# Patient Record
Sex: Male | Born: 1965 | Race: White | Hispanic: No | Smoking: Current every day smoker
Health system: Southern US, Community
[De-identification: ages and names within clinical notes are randomized; demographics above are authoritative.]

## PROBLEM LIST (undated history)

## (undated) HISTORY — PX: CARPAL TUNNEL RELEASE: SHX101

## (undated) HISTORY — PX: BACK SURGERY: SHX140

---

## 2001-04-21 ENCOUNTER — Encounter: Admission: RE | Admit: 2001-04-21 | Discharge: 2001-07-20 | Payer: Self-pay

## 2001-07-22 ENCOUNTER — Encounter: Admission: RE | Admit: 2001-07-22 | Discharge: 2001-10-20 | Payer: Self-pay

## 2002-01-13 ENCOUNTER — Encounter: Admission: RE | Admit: 2002-01-13 | Discharge: 2002-04-13 | Payer: Self-pay

## 2008-07-09 ENCOUNTER — Emergency Department (HOSPITAL_BASED_OUTPATIENT_CLINIC_OR_DEPARTMENT_OTHER): Admission: EM | Admit: 2008-07-09 | Discharge: 2008-07-09 | Payer: Self-pay | Admitting: Emergency Medicine

## 2008-07-19 ENCOUNTER — Emergency Department (HOSPITAL_BASED_OUTPATIENT_CLINIC_OR_DEPARTMENT_OTHER): Admission: EM | Admit: 2008-07-19 | Discharge: 2008-07-19 | Payer: Self-pay | Admitting: Emergency Medicine

## 2008-09-18 ENCOUNTER — Emergency Department (HOSPITAL_BASED_OUTPATIENT_CLINIC_OR_DEPARTMENT_OTHER): Admission: EM | Admit: 2008-09-18 | Discharge: 2008-09-18 | Payer: Self-pay | Admitting: Emergency Medicine

## 2009-01-12 ENCOUNTER — Ambulatory Visit: Payer: Self-pay | Admitting: Interventional Radiology

## 2009-01-12 ENCOUNTER — Emergency Department (HOSPITAL_BASED_OUTPATIENT_CLINIC_OR_DEPARTMENT_OTHER): Admission: EM | Admit: 2009-01-12 | Discharge: 2009-01-12 | Payer: Self-pay | Admitting: Emergency Medicine

## 2009-07-04 ENCOUNTER — Ambulatory Visit: Payer: Self-pay | Admitting: Radiology

## 2009-07-04 ENCOUNTER — Emergency Department (HOSPITAL_BASED_OUTPATIENT_CLINIC_OR_DEPARTMENT_OTHER): Admission: EM | Admit: 2009-07-04 | Discharge: 2009-07-04 | Payer: Self-pay | Admitting: Emergency Medicine

## 2010-03-05 NOTE — ED Notes (Signed)
 SABRA

## 2010-06-29 NOTE — Consult Note (Signed)
Ronald Williams, EUDY NO.:  [id number]   MEDICAL RECORD NO.:  [id number]                   PATIENT TYPE:  REC   LOCATION:  [facility]                                  FACILITY:  [facility]   PHYSICIAN:  Zachary George, DO                      DATE OF BIRTH:  15-Feb-1965   DATE OF CONSULTATION:  01/18/2002  DATE OF DISCHARGE:                                   CONSULTATION   The patient returns to the clinic today as scheduled for reevaluation.  He  was last seen on September 18, 2001.  He states that his low back pain is still  significantly improved following radiofrequency medial branch neurotomy on  the left in June of 2003 and on the right in July of 2003.  He states that  he does get some increased pain at times with activity.  His pain today is a  3/10 on a subjective scale.  Overall his functional quality of life indices  have improved significantly.  He is able to be much more active and  performing job-related tasks which he was unable to do prior to the  neurotomy.  He continues using Ultracet four to six per day as needed and  takes Flexeril as needed at bedtime.  In addition, he states he has no  further numbness or paresthesias in his hands and is no longer wearing his  left wrist splint. 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  He continues to seek other employment.  I reviewed  health and history form and 14-point review of systems.  I discussed further  treatment options with the patient.   PHYSICAL EXAMINATION:  GENERAL:  A healthy male in no acute distress.  VITAL SIGNS:  Blood pressure 134/68, pulse 77, respirations 18, O2  saturation 100% on room air.  BACK:  Level pelvis without scoliosis.  There is no tenderness to palpation  of bilateral lumbar paraspinals.  Range of motion is full in all planes with  mild discomfort on flexion.  Manual muscle testing is 5/5 bilateral upper  and lower extremities.  There is no  atrophy noted in the intrinsic hand  muscles bilaterally. Sensory examination is intact to light touch bilateral  upper and lower extremities. Muscle stretch reflexes are 2+/4 bilateral  patellar, medial hamstrings, and Achilles. Straight leg raise is negative in  the seated position bilaterally.   IMPRESSION:  1. Lumbar facet syndrome, status post radiofrequency neurotomy of the lumbar     medial branches bilaterally with significant improvement in low back pain     and functional indices.  2. Carpal tunnel syndrome, left hand, resolved.   PLAN:  1. Continue home-based exercise program.  2. Continue Ultracet and Flexeril as needed.  3. The patient is to follow up with his primary care Ziana Heyliger.  4. The patient is to  return to the clinic on an as-needed basis.  Would     consider repeating radiofrequency procedure if symptoms worsened toward     preprocedure pain levels.  I discussed this with the patient.  As this     remains an option for [place] in the future, I have instructed him to continue     with his exercise program and his current medications.  I counseled him     to expect occasional flare-ups in his pain and I would anticipate that     these would only last for a few days when they occur and that we need to     treat him symptomatically in that regard.   The patient was educated in the above findings and recommendations and  understands. There were no barriers to communication.                                               Zachary George, DO    JW/MEDQ  D:  01/18/2002  T:  01/18/2002  Job:  [id number]   cc:   Paulina Fusi, M.D.

## 2010-06-29 NOTE — Consult Note (Signed)
Ronald Williams, MORING NO.:  [id number]   MEDICAL RECORD NO.:  [id number]                   PATIENT TYPE:   LOCATION:                                       FACILITY:   PHYSICIAN:  Sondra Come, D.O.                 DATE OF BIRTH:  [birth year]   DATE OF CONSULTATION:  09/18/2001  DATE OF DISCHARGE:                  PHYSICAL MEDICINE & REHABILITATION CONSULTATION   REASON FOR CONSULTATION:  The patient returns to clinic today for re-  evaluation.  He is status post right lumbar medial branch radiofrequency  procedure on 08/21/01.  He states his low back pain seems to be progressively  improving.  He is also status post left medial branch radiofrequency  procedure on 07/15/01.  His function and quality of life indices have improved  significantly.  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  His  pain today is a 4/10 on a subjective scale.  In addition, he continues to  have improvement in his left hand numbness and paresthesias secondary to  carpal tunnel syndrome.  He continues wearing his splint as needed, and has  found Zonegran to give him significant improvement.  He continues on  Ultracet approximately six pills a day, and Flexeril 10 mg one to two at  bedtime as needed for sleep.  He has run out of the sample pack of Zonegran,  and a prescription has been called in for him.  He requests 30 day  prescriptions for Flexeril and Ultracet.  I previously had written him 90  day prescriptions to send off to his insurance company and he has not  received these yet.  I review health and history form and 14 point review of  systems.  No new neurologic complaints.  Denies bowel or bladder  dysfunction.   PHYSICAL EXAMINATION:  GENERAL:  A healthy male in no acute distress.  VITAL SIGNS:  Blood pressure 121/73, pulse 66, respirations 18, O2  saturation 99% on room air.  BACK:  Level pelvis without scoliosis.  There is minimal tenderness to  palpation bilateral lumbar paraspinals.  There is full range of motion of  the lumbar spine in all planes and with minimal discomfort.  NEUROLOGIC:  Intact to the lower extremities, including motor, sensory, and  reflexes today.  EXTREMITIES:  Examination of the left upper extremity does not reveal any  atrophy in the left hand muscles.  There is normal strength, especially with  median innervated muscles.  Tinel is mildly positive at the left wrist,  which is improved.   IMPRESSION:  1. Lumbar facet syndrome, status post radiofrequency to the lumbar medial     branches bilaterally with improved low back pain.  2. Carpal tunnel syndrome, left hand, improving.   PLAN:  1. Continue Ultracet one or two p.o. t.i.d. p.r.n. #180 without refills.     This is in addition to a 90 day supply from [employer].  2.  Flexeril 10 mg one or two p.o. q.h.s. p.r.n. #60 without refills.  This     is in addition to a 90 day supply from [employer].  3. Continue Zonegran 100 mg q.d.  New prescription for one p.o. q.d. #90     without refills, to be sent in to the patient's insurance company.  The     patient does have a 30 day supply waiting for him at the pharmacy.  4. The patient is to continue wearing his wrist splint as needed.  5. The patient is to return to clinic in four months for re-evaluation,     sooner as needed.   The patient was educated on the above findings and recommendations and  understands.  There were no barriers to communication.                                               Sondra Come, D.O.    JJW/MEDQ  D:  09/18/2001  T:  09/23/2001  Job:  [id number]

## 2010-06-29 NOTE — Consult Note (Signed)
[facility]  Patient:    Ronald Williams, Ronald Williams Visit Number: [id number] MRN: [id number]          Service Type: Attending:  Sondra Come, D.O. Dictated by:   Sondra Come, D.O. Proc. Date: 07/01/01   CC:         Dorena Bodo, M.D. [facility] [address] [place], [place] [address]                          Consultation Report  NEW PATIENT CONSULTATION NOTE  REFERRING Mistina Coatney:  Dr. Dorena Bodo, [facility] [address]  [place], [place] [address].  CHIEF COMPLAINT:  Low back pain.  HISTORY OF PRESENT ILLNESS:  Ronald Williams is a pleasant 45 year old right-hand dominant male with chronic low back pain who has been followed by Dr. Rayna Sexton at the [facility].  Apparently, Dr. Rayna Sexton no longer takes the patients insurance, and he has been referred here for possible radiofrequency neuroablation of the lumbar facet joints.  The patient had undergone a trial of lumbar facet injections.  He most recently had bilateral L3-4, L4-5, and L5-S1 facet injections which gave him 100% relief for 1.5 weeks.  This positive response has led to consideration of radiofrequency neuroablation.  The patient states that he has had epidural steroid injections in the past without significant relief.  He has been unable to tolerate narcotic based pain medications, and has most recently been treated with Ultracet two p.o. q.i.d. p.r.n., in addition to Flexeril.  His pain today is a 5/10 on a subjective scale.  I review health and history form and 14 point review of systems.  Function and quality of life indices have declined overall.  His sleep is fair to good.  In addition, the patient complains of pain in his left wrist and hand over the past week.  He has associated numbness and paresthesias involving the left middle finger, ring finger, and small finger.  He thought he might have carpal tunnel syndrome so he purchased a  splint for his left wrist which he has been wearing for about four days without any significant improvement.  He was evaluated by his primary care physician today.  He thinks he may also have tennis elbow.  REVIEW OF SYSTEMS:  Some constipation, occasional headaches, and occasional lower extremity pain with ambulation.  PAST MEDICAL HISTORY:  Mild depression.  PAST SURGICAL HISTORY:  Denies.  FAMILY HISTORY:  Diabetes and hypertension.  SOCIAL HISTORY:  The patient smokes 1 pack of cigarettes per day, and I counseled him on the importance of smoking cessation in terms of pain and overall health.  The patient admits to occasional alcohol use.  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  ALLERGIES:  FLONASE.  MEDICATIONS: 1. Flexeril. 2. Wellbutrin. 3. Ultracet. 4. Ibuprofen.  PHYSICAL EXAMINATION:  GENERAL:  A healthy male in no acute distress.  BACK:  Level pelvis without scoliosis.  There is normal lumbar lordosis. Range of motion is full in all planes with significant pain on extension, and extension plus rotation.  Manual muscle testing is 5/5 bilateral lower extremities.  Sensory examination is intact to light touch bilateral lower extremities.  Muscle stretch reflexes are 2+/4 bilateral patellar, 1+/4 bilateral medial hamstrings and Achilles.  Straight leg raise is negative bilaterally, but with  tight hamstrings.  Examination of the left upper extremity reveals no atrophy.  Grip strength is 5/5.  There is positive Tinel over the median nerve at the left wrist, negative over the ulnar nerve at the left wrist, negative over the ulnar nerve at the left elbow.  Phalen is positive.  There is tenderness to palpation over the left lateral epicondyle which is increased with passive wrist flexion and resisted wrist extension.  IMPRESSION: 1. Chronic low back pain with lumbar  facet syndrome with positive response to    lumbar facet injections. 2. Degenerative disc disease of the lumbar spine. 3. Possible mild carpal tunnel syndrome on the left. 4. Mild left lateral epicondylitis.  PLAN: 1. The patient is appropriate for radiofrequency neuroablation of the lumbar    facet joints.  I discussed this with him at length, including the procedure    in detail and its associated risks, benefits, limitations, and alternatives    to include bleeding, infection, nerve injury, failure to relieve pain,    increased pain.  The patient understands the risks associated, and wishes    to proceed in this fashion. 2. Continue current medications, including Flexeril and Ultracet.  We will    have the patient discontinue his Motrin 800 mg approximately seven days    prior to procedure. 3. The patient is instructed to continue wearing his wrist splint. 4. The patient is instructed to use alternating ice and heat for his left    elbow and left wrist. 5. Consider electrodiagnostic studies in two to three weeks to rule out carpal    tunnel syndrome if symptoms are not improving. 6. The patient is to return to clinic next available for radiofrequency    neuroablation.  The patient was educated on the above findings and recommendations and understands.  There were no barriers to communication. Dictated by:   Sondra Come, D.O. Attending:  Sondra Come, D.O. DD:  07/01/01 TD:  07/03/01 Job: [id number]

## 2010-06-29 NOTE — Consult Note (Signed)
[facility]  Patient:    Ronald Williams, Ronald Williams Visit Number: [id number] MRN: [id number]          Service Type: PMG Location: [facility] Attending Physician:  Sondra Come Dictated by:   Sondra Come, D.O. Proc. Date: 07/24/01 Admit Date:  07/22/2001                            Consultation Report  HISTORY:  The patient returns to the clinic today for electrodiagnostic studies of his left upper extremity to rule out carpal tunnel syndrome. Please refer to the electrodiagnostic study report for details of the study. 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  This is consistent with carpal tunnel syndrome.  I discussed further treatment options with the patient, who has been using a splint on his left wrist without any significant improvement.  We discussed chiropractic manipulation, medications and local steroid injections.  We also discussed surgical options.  The patient wishes to have a steroid injection into the carpal tunnel, and we discussed this at length.  This is reasonable.  I discussed the risks, benefits, limitations and alternatives of this and the patient wishes to proceed.  PROCEDURE:  Left carpal tunnel steroid injection.  Informed consent was obtained.  The skin was prepped in the usual fashion with Betadine and alcohol.  The left carpal tunnel was injected with 0.5 cc of Kenalog, 40 mg/cc plus 1 cc of 1% lidocaine using a 27-gauge, 1/2-inch needle without any complications.  There were no paresthesias noted during the injection. The patient tolerated the procedure well.  Postinjection, the patient had an appropriate right median nerve block.  PLAN:  The patient is to return to the clinic for reevaluation. Dictated by:   Sondra Come, D.O. Attending Physician:  Sondra Come DD:  07/26/01 TD:  07/28/01 Job: [id number]

## 2010-06-29 NOTE — Consult Note (Signed)
[facility]  Patient:    BRAD, LIEURANCE Visit Number: [id number] MRN: [id number]          Service Type: PMG Attending Physician:  Sondra Come Dictated by:   Sondra Come, D.O. Proc. Date: 07/15/01 Admit Date:  04/21/2001 Discharge Date: 07/20/2001   CC:         Dorena Bodo, M.D., [facility], [address] [place], [place] [address]                          Consultation Report  HISTORY OF PRESENT ILLNESS:  Mr. Coykendall returns to clinic today as scheduled for radiofrequency neural ablation of the left lumbar facets.  The patient continues to have low back pain which he states has worsened.  He feels like the lumbar facet injections are wearing off.  Currently his pain is a 10/10 on a subjective scale.  I review health and history form, and 14-point review of systems.  The patient denies any new neurologic complaints.  Denies bowel and bladder dysfunction.  He continues taking Ultracet, which helps take the edge off of his pain.  The patient continues to have left hand numbness, paresthesias and pain, which are not improved with the wrist splint.  PHYSICAL EXAMINATION:  GENERAL:  The patient is a healthy male in no acute distress.  VITAL SIGNS:  Blood pressure 148/64, pulse 72, respirations 14 and regular, O2 saturation is 97% on room air.  NEUROLOGIC:  No new neurologic findings in the lower extremities including motor, sensory and reflexes at this time.  IMPRESSION: 1. Lumbar facet syndrome with positive response to lumbar facet injections. 2. Degenerative disk disease of the lumbar spine. 3. Probable carpal tunnel syndrome on the left.  PLAN:  Radiofrequency neural ablation of the left L2, L3, and L4 medial branches, as well as the left L5 dorsal ramus.  The procedure again was described to the patient in detail including risks, benefits, limitations, and alternatives.  The patient wishes to proceed.  Informed consent was  obtained.  DESCRIPTION OF PROCEDURE:  The patient was brought back to the fluoroscopy suite where an IV was started.  The patient was given 2 mg of Versed for minimal sedation.  The patient remained awake and alert during the procedure. The skin was prepped and draped in the usual sterile fashion.  Skin and subcutaneous tissues were anesthetized with 3 cc of 1% lidocaine at four independent needle access points.  Under direct fluoroscopic guidance, a 20-gauge radiofrequency needle with 10 mm curved active tip was advanced to the junction of the left sacral ala and S1 superior articular process under multiple fluoroscopic views.  The needle was then advanced to the junction of the L3, L4, and L5 superior articular processes with their corresponding transverse processes under multiple fluoroscopic views.  These were done using independent needle access points.  Once bony contact was made in each location, negative aspiration was performed.  Needle tip confirmation was obtained with the injection of 0.2 cc of Omnipaque 180.  There was no epidural or epiradicular or vascular flow.  Each site was then stimulated appropriately for sensory and motor.  This was followed by the injection of 0.25 cc of Kenalog 40 mg/cc plus 0.5 cc of 0.25% preservative-free Bupivacaine.  Needle tip placement was reconfirmed under fluoroscopy, and each site was lesioned for 70 seconds at 70 degrees.  The patient tolerated the procedure well.  He was monitored post-injection  and discharged in stable condition. Post-injection pain is 5/10.  Discharge instructions were given.  The patient was instructed to use ice on his back as needed for rebound pain.  RECOMMENDATION: 1. The patient is to continue Ultracet as needed. 2. The patient is to return to clinic for electrodiagnostic studies of his    left upper extremity to rule out carpal tunnel syndrome versus    radiculopathy. 3. Anticipate radiofrequency neural  ablation of the right lumbar facets.  The patient was educated on the above findings and recommendations, and understands.  There were no barriers to communication. Dictated by:   Sondra Come, D.O. Attending Physician:  Sondra Come DD:  07/15/01 TD:  07/18/01 Job: [id number]

## 2010-06-29 NOTE — Consult Note (Signed)
[facility]  Patient:    Ronald Williams, Ronald Williams Visit Number: [id number] MRN: [id number]          Service Type: PMG Location: [facility] Attending Physician:  Sondra Come Dictated by:   Sondra Come, D.O. Proc. Date: 08/21/01 Admit Date:  07/22/2001                            Consultation Report  HISTORY:  Mr. Mousel returns to the clinic today for reevaluation and right lumbar facet radiofrequency neural oblation. The patient underwent left lumbar facet radiofrequency neural oblation on July 15, 2001 with significant improvement of his low back pain. He states he has been more active over the past week or two and has had increased pain in his low back. His pain is more centrally located today. He would like to proceed with radiofrequency neural oblation and he is appropriate based on previous diagnostic lumbar facet blocks. In terms of the patients left carpal tunnel syndrome, he had complete relief of his left hand numbness and paresthesias for the anesthetic phase of the carpal tunnel injection which was performed on July 24, 2001. He states that he continues to have some tingling in his hand but it is improved. He continues to have improving strength subjectively. Overall his pain today is a 7/10 on a subjective scale. His functional and quality of life indices have improved, his sleep is good. He continues on Ultracet two pills three times daily and Flexeril 10 mg 1-2 at bedtime. He also continues to take Motrin as needed which does not seem to be improving his symptoms. I reviewed his health and history form and 14 point review of systems. The patient denies any new neurologic complaints, denies bowel or bladder dysfunction.  PHYSICAL EXAMINATION: Reveals a healthy male in no acute distress. Blood pressure 133/78, pulse 79, respirations 16, O2 saturation 100% on room air. Examination of the back reveals significant tightness and tenderness in the right  lumbar paraspinal region. The leg paraspinal muscles are soft and supple with minimal discomfort. Range of motion reveals pain on extension. Manual muscle testing is 5/5 bilateral lower extremities. Sensory examination is intact to light touch bilateral lower extremities. Muscle stretch reflexes are symmetric bilateral lower extremities. No atrophy in the left intrinsic hand muscles. Minimal tingling sensation to light touch in the fingertips today on the left.  IMPRESSION: 1. Lumbar facet syndrome. 2. Carpal tunnel syndrome.  PLAN: 1. Right lumbar facet radiofrequency and neural oblation. 2. Continue Ultracet 1-2 p.o. t.i.d. as needed #540 without refills. This is    a three month supply. 3. Continue Flexeril 10 mg 1-2 p.o. q.h.s. as needed #180 without refills.    This is a three month supply. 4. Trial of zonegran 100 mg q.d. #14 day sample pack given. If this is    helpful with the patients paresthesias patient to call our clinic and    we will call in a refill or write a prescription. 5. The patient instructed to resume Bextra 20 mg q.d. to b.i.d. as needed. 6. The patient will return to the clinic in one month for reevaluation. Will    consider repeat carpal tunnel injection versus surgical referral.  PROCEDURE:  Right lumbar facet radiofrequency neural oblation of the L2, L3, and L4 medial branches as well as the L5 dorsal ramus.  The procedure was described to the patient in detail including the The risks, benefits, limitations and alternatives. The  patient wishes to proceed. Informed consent was obtained. The patient was brought back to the fluoroscopy suite where an IV was started. The patient was given 2 mg of Versed for minimal but appropriate conscious sedation. The patient remained awake and alert during the procedure.  DESCRIPTION OF PROCEDURE:  The skin was prepped and draped in the usual sterile fashion. The skin and subcutaneous tissues were anesthetized with 2-3  cc of 1% lidocaine at four independent needle access points. Under direct fluoroscopic guidance, a 20 gauge radiofrequency needle with 10 mm active curved tip was advanced to the junction of the right sacral ala and S1 superior articular process under multiple fluoroscopic views including AP, oblique and lateral. The needle was then advanced to the junction of the L3, L4 and L5 superior articular processes with their corresponding transverse processes again under multiple fluoroscopic views including oblique and lateral. Independent needle access points were used. Once bony contact was made at each location, negative aspiration was performed. Needle tip placement was confirmed with the injection of 0.2 cc of Omnipaque 180 with appropriate flow. There was no epidural, epiradicular, or vascular flow. Each site was then stimulated appropriately for sensory and motor. This was then followed by the injection of 0.25 cc of Kenalog 40 mg/cc plus 0.5 cc of 0.25% preservative free bupivacaine. Needle tip placement was reconfirmed under fluoroscopy and each site was lesioned for 70 seconds at 70 degrees. The patient tolerated the procedure well. He was monitored post injection and discharged in stable condition. Discharge instructions were given. The patient instructed to use ice on his back for rebound pain as needed.  The patient was educated on the above findings and recommendations and understands. No barriers to communication. Dictated by:   Sondra Come, D.O. Attending Physician:  Sondra Come DD:  08/21/01 TD:  08/22/01 Job: [id number]

## 2011-12-03 ENCOUNTER — Emergency Department (HOSPITAL_COMMUNITY)
Admission: EM | Admit: 2011-12-03 | Discharge: 2011-12-03 | Disposition: A | Discharge status: Home / Self Care | Payer: Self-pay | Attending: Emergency Medicine | Admitting: Emergency Medicine

## 2011-12-03 ENCOUNTER — Encounter (HOSPITAL_COMMUNITY): Payer: Self-pay

## 2011-12-03 ENCOUNTER — Emergency Department (HOSPITAL_COMMUNITY): Payer: Self-pay

## 2011-12-03 DIAGNOSIS — N200 Calculus of kidney: Secondary | ICD-10-CM | POA: Insufficient documentation

## 2011-12-03 DIAGNOSIS — F17200 Nicotine dependence, unspecified, uncomplicated: Secondary | ICD-10-CM | POA: Insufficient documentation

## 2011-12-03 LAB — CBC
HCT: 40.4 % (ref 39.0–52.0)
Hemoglobin: 13.9 g/dL (ref 13.0–17.0)
MCH: 30.6 pg (ref 26.0–34.0)
MCHC: 34.4 g/dL (ref 30.0–36.0)
MCV: 89 fL (ref 78.0–100.0)
Platelets: 205 K/uL (ref 150–400)
RBC: 4.54 MIL/uL (ref 4.22–5.81)
RDW: 12.5 % (ref 11.5–15.5)
WBC: 10.9 K/uL — ABNORMAL HIGH (ref 4.0–10.5)

## 2011-12-03 LAB — URINALYSIS, ROUTINE W REFLEX MICROSCOPIC
Bilirubin Urine: NEGATIVE
Glucose, UA: NEGATIVE mg/dL
Ketones, ur: NEGATIVE mg/dL
Leukocytes, UA: NEGATIVE
Nitrite: NEGATIVE
Protein, ur: 30 mg/dL — AB
Specific Gravity, Urine: 1.017 (ref 1.005–1.030)
Urobilinogen, UA: 1 mg/dL (ref 0.0–1.0)
pH: 8.5 — ABNORMAL HIGH (ref 5.0–8.0)

## 2011-12-03 LAB — URINE MICROSCOPIC-ADD ON

## 2011-12-03 LAB — BASIC METABOLIC PANEL WITH GFR
BUN: 14 mg/dL (ref 6–23)
CO2: 27 meq/L (ref 19–32)
Calcium: 9.8 mg/dL (ref 8.4–10.5)
Chloride: 104 meq/L (ref 96–112)
Creatinine, Ser: 1.12 mg/dL (ref 0.50–1.35)
GFR calc Af Amer: >90 mL/min
GFR calc non Af Amer: 78 mL/min — ABNORMAL LOW
Glucose, Bld: 104 mg/dL — ABNORMAL HIGH (ref 70–99)
Potassium: 4 meq/L (ref 3.5–5.1)
Sodium: 141 meq/L (ref 135–145)

## 2011-12-03 MED ORDER — KETOROLAC TROMETHAMINE 60 MG/2ML IM SOLN: 60.0000 mg | Freq: Once | INTRAMUSCULAR | Status: DC

## 2011-12-03 MED ORDER — NAPROXEN 375 MG PO TABS: 375.0000 mg | ORAL_TABLET | Freq: Two times a day (BID) | ORAL | Status: DC

## 2011-12-03 MED ORDER — HYDROCODONE-ACETAMINOPHEN 5-325 MG PO TABS: 1.0000 | ORAL_TABLET | Freq: Four times a day (QID) | ORAL | Status: DC | PRN

## 2011-12-03 MED ORDER — FENTANYL CITRATE 0.05 MG/ML IJ SOLN
50.0000 ug | Freq: Once | INTRAMUSCULAR | Status: AC
  Administered 2011-12-03 (×2): 50 ug via INTRAVENOUS
  Filled 2011-12-03: qty 2

## 2011-12-03 MED ORDER — KETOROLAC TROMETHAMINE 30 MG/ML IJ SOLN
30.0000 mg | Freq: Once | INTRAMUSCULAR | Status: AC
  Administered 2011-12-03 (×2): 30 mg via INTRAVENOUS
  Filled 2011-12-03: qty 1

## 2011-12-03 NOTE — Discharge Instructions (Signed)
 You have been diagnosed with kidney stones.  Use your pain medication as prescribed and do not operate heavy machinery while on this medication. Note that your pain medication contains Acetaminophen  (Tylenol ), therefore it is not recommended to take additional Tylenol  while on your pain medication. Continue to drink fluids to help you pass the stones.  Followup with your primary care doctor in regards to your hospital visit.  Return to the ED immediately if you develop fever that persists > 101, uncontrolled pain or vomiting, or other concerns.  Read the instructions below to learn more about kidney stones.  If you do not have a primary care doctor to followup with, use the resource guide attached to help you find one.   Kidney Stones Kidney stones (ureteral lithiasis) are solid masses that form inside your kidneys. The intense pain is caused by the stone moving through the kidney, ureter, bladder, and urethra (urinary tract). When the stone moves, the ureter starts to spasm around the stone. The stone is usually passed in the urine.  HOME CARE  Drink enough fluids to keep your pee (urine) clear or pale yellow. This helps to get the stone out.   Only take medicine as told by your doctor.   Follow up with your doctor as told.  GET HELP RIGHT AWAY IF:   Your pain does not get better with medicine.   You have a fever.   Your pain increases and gets worse over 18 hours.   You have new belly (abdominal) pain.   You feel faint or pass out.  MAKE SURE YOU:   Understand these instructions.   Will watch your condition.   Will get help right away if you are not doing well or get worse.   Affordable Health Care Below is information to help you research and register for [facility] (Obama care) that was started on November 12, 2011. Keep in mind when comparing different insurance plan prices that you may qualify for for financial assistance to lower your costs  The same application  will let you find out if you and your family members might qualify for free or low-cost coverage available through Medicaid or the [employer] (CHIP).  www.Http://www.long-Ju.com/: Where individuals can learn about the Marketplace and the upcoming benefits (including where they can find local assistance), or be connected to appropriate resources in states that are running their own Marketplace.  Health Insurance Marketplace Call Center: If you have questions, call [phone]. TTY users should call [phone], Open 24 hours a day, 7 days a week.   epompanobeach.com.br: Where to get the latest resources to help spread the word about Affordable Health Care & the Marketplace. This website also has health visitor in many different languages.    More information:   Health insurance companies selling plans through the Marketplace cant deny you coverage or charge you more due to pre-existing health conditions, and they cant charge women and men different premiums based on their gender.  The information is all available online, but you can apply 4 ways: online, by phone, by mail, or in-person with the help of a trained assister or navigator.   Each health plan will generally offer comprehensive coverage, including a core set of essential health benefits like doctor visits, preventive care, maternity care, hospitalization, prescription drugs, and more.   No matter where you live, there will be a Marketplace in your state, offering plans from private companies where youll be able to compare your health  coverage options based on price, benefits, quality, and other features important to you before you make a choice.   If you have no primary doctor, here are some resources that may be helpful:  Medicaid-accepting [facility] Providers:   - [facility]- [address]      [phone]      Mon-Fri 9am-7pm, Sat 9am-1pm   - [facility]- [address] [place], [place]      [phone]   - [facility]- [address], [address] [place]      [phone]   [facility]- [address]      [phone]   - Kennieth Leech- [address] [place], [address]      [phone]      Only accepts [place] patients after they have her name applied to their card   Self Pay (no insurance) in [place]:   - Sickle Cell Patients: Dr Camellia Hutchinson, [facility]      [address] [place]      [phone]   - Physician Referral Service- [phone]   - [facility]- [address] [place]      [phone]   [facility] [place]- [address]   - [facility]- see information above      (Speak to [employer] if you do not have insurance)   - [facility]- [address]      [phone]   - Dr Catalina-  [address], [address], [place]

## 2011-12-03 NOTE — ED Notes (Signed)
Pt undressed, in gown, on continuous pulse oximetry and blood pressure cuff; urinal handed to pt to use when he can

## 2011-12-03 NOTE — ED Provider Notes (Signed)
History     CSN: [id number]  Arrival date & time 12/03/11  1009   First MD Initiated Contact with Patient 12/03/11 1006      Chief Complaint  Patient presents with  . Abdominal Pain    (Consider location/radiation/quality/duration/timing/severity/associated sxs/prior treatment) HPI Comments: Patient is a 46 year old male with a history of nephrolithiasis presents emergency department complaining of left-sided flank pain.  Onset of symptoms began this morning & described as sharp. Severity is rated at 10/10 with radiation to groin. Associated symptoms include nausea.  Patient denies any dysuria, hematuria, testicular or penile pain, discharge, vomiting, fever, night sweats, chills, back injury, back pain, change in bowel movements, melena, hematemesis, hematochezia.  Pain is worsened by any movement.  The history is provided by the patient.    History reviewed. No pertinent past medical history.  Past Surgical History  Procedure Date  . Carpal tunnel release   . Back surgery     History reviewed. No pertinent family history.  History  Substance Use Topics  . Smoking status: Current Every Day Smoker  . Smokeless tobacco: Not on file  . Alcohol Use: Yes     social drinker      Review of Systems  Constitutional: Negative for fever, chills, diaphoresis and fatigue.  HENT: Negative for ear pain and sinus pressure.   Eyes: Negative for visual disturbance.  Respiratory: Negative for cough.   Cardiovascular: Negative for chest pain.  Gastrointestinal: Negative for nausea, vomiting and abdominal pain.  Genitourinary: Positive for flank pain. Negative for dysuria, urgency and difficulty urinating.  Skin: Negative for color change and rash.  Neurological: Negative for dizziness and headaches.  Psychiatric/Behavioral: Negative for confusion.  All other systems reviewed and are negative.    Allergies  Codeine  Home Medications   Current Outpatient Rx  Name Route Sig  Dispense Refill  . BENADRYL PO Oral Take 1 tablet by mouth daily as needed. allergies    . IBUPROFEN 200 MG PO TABS Oral Take 400 mg by mouth every 6 (six) hours as needed. For pain    . OVER THE COUNTER MEDICATION Nasal Place 1 spray into the nose daily as needed. Nasal spray  For nasal congestion and dryness      BP 116/56  Pulse 60  Temp 97.5 F (36.4 C) (Oral)  Resp 18  SpO2 99%  Physical Exam  Nursing note and vitals reviewed. Constitutional: He is oriented to person, place, and time. He appears well-developed and well-nourished. He appears distressed.  HENT:  Head: Normocephalic and atraumatic.  Eyes: Conjunctivae normal and EOM are normal.  Neck: Normal range of motion. Neck supple.  Cardiovascular: Normal rate, regular rhythm and normal heart sounds.   Pulmonary/Chest: Effort normal.  Abdominal: Soft. Normal appearance and bowel sounds are normal. He exhibits no distension, no ascites and no pulsatile midline mass. There is tenderness (left CVA tenderness). There is CVA tenderness.  Musculoskeletal: Normal range of motion.  Neurological: He is alert and oriented to person, place, and time.  Skin: Skin is warm and dry. He is not diaphoretic.  Psychiatric: He has a normal mood and affect. His behavior is normal.    ED Course  Procedures (including critical care time)  Labs Reviewed  CBC - Abnormal; Notable for the following:    WBC 10.9 (*)     All other components within normal limits  BASIC METABOLIC PANEL - Abnormal; Notable for the following:    Glucose, Bld 104 (*)  GFR calc non Af Amer 78 (*)     All other components within normal limits  URINALYSIS, ROUTINE W REFLEX MICROSCOPIC   Ct Abdomen Pelvis Wo Contrast  12/03/2011  *RADIOLOGY REPORT*  Clinical Data: Left flank pain  CT ABDOMEN AND PELVIS WITHOUT CONTRAST  Technique:  Multidetector CT imaging of the abdomen and pelvis was performed following the standard protocol without intravenous contrast.   Comparison: None.  Findings: Sagittal images of the spine shows degenerative changes lumbar spine at L1-L2 level.  Lung bases are unremarkable.  Mild atherosclerotic calcifications of the abdominal aorta.  Unenhanced liver, spleen, pancreas and adrenals are unremarkable. There is mild left hydronephrosis and mild proximal left hydroureter.  Nonobstructive calcified calculus in lower pole of the right kidney measures 1 mm.  Nonobstructive calcified calculus in mid pole of the left kidney measures 3 mm.  In axial image 34 there is a calcified obstructive calculus in the proximal left ureter measures 4.4 mm at the level of the lower endplate of the L2 vertebral body.  No small bowel obstruction.  Moderate colonic stool.  Normal appendix is clearly visualized axial image 51.  No pericecal inflammation.  Bilateral distal ureter is unremarkable.  Prostate gland seminal vesicles are unremarkable.  Urinary bladder is unremarkable.  No calcified calculi are noted within urinary bladder.  No destructive bony lesions are noted within the pelvis.  IMPRESSION:  1.  There is bilateral nonobstructive nephrolithiasis. 2.  Mild left hydronephrosis and proximal left hydroureter. 3.  There is 4.4 mm calcified obstructive calculus in proximal left ureter at the level of the lower endplate of the L2 vertebral body. 4.  Normal appendix. 5.  Unremarkable urinary bladder.   Original Report Authenticated By: Natasha Mead, M.D.      No diagnosis found.    MDM  Kidney stone   Pt has been diagnosed with a Kidney Stone via CT. There is no evidence of significant hydronephrosis, serum creatine WNL, vitals sign stable and the pt does not have irratractable vomiting. Pt will be dc home with pain medications & has been advised to follow up with PCP.        Jaci Carrel, [place] 12/03/11 1155

## 2011-12-03 NOTE — ED Notes (Signed)
Patient transported to CT 

## 2011-12-03 NOTE — ED Provider Notes (Signed)
Medical screening examination/treatment/procedure(s) were performed by non-physician practitioner and as supervising physician I was immediately available for consultation/collaboration.    Hamp Moreland L Arienne Gartin, MD 12/03/11 1157 

## 2011-12-03 NOTE — ED Notes (Signed)
Pt presents to ED with abdominal pain radiating to groin. Pt states symptoms started this am.

## 2013-05-15 ENCOUNTER — Emergency Department (HOSPITAL_COMMUNITY)
Admission: EM | Admit: 2013-05-15 | Discharge: 2013-05-15 | Disposition: A | Discharge status: Home / Self Care | Payer: No Typology Code available for payment source | Attending: Emergency Medicine | Admitting: Emergency Medicine

## 2013-05-15 ENCOUNTER — Emergency Department (HOSPITAL_COMMUNITY): Payer: No Typology Code available for payment source

## 2013-05-15 ENCOUNTER — Encounter (HOSPITAL_COMMUNITY): Payer: Self-pay | Admitting: Emergency Medicine

## 2013-05-15 DIAGNOSIS — Z23 Encounter for immunization: Secondary | ICD-10-CM | POA: Insufficient documentation

## 2013-05-15 DIAGNOSIS — T148XXA Other injury of unspecified body region, initial encounter: Secondary | ICD-10-CM

## 2013-05-15 DIAGNOSIS — S91009A Unspecified open wound, unspecified ankle, initial encounter: Secondary | ICD-10-CM

## 2013-05-15 DIAGNOSIS — S81009A Unspecified open wound, unspecified knee, initial encounter: Secondary | ICD-10-CM | POA: Insufficient documentation

## 2013-05-15 DIAGNOSIS — S2239XA Fracture of one rib, unspecified side, initial encounter for closed fracture: Secondary | ICD-10-CM

## 2013-05-15 DIAGNOSIS — S01501A Unspecified open wound of lip, initial encounter: Secondary | ICD-10-CM | POA: Insufficient documentation

## 2013-05-15 DIAGNOSIS — S2249XA Multiple fractures of ribs, unspecified side, initial encounter for closed fracture: Secondary | ICD-10-CM | POA: Insufficient documentation

## 2013-05-15 DIAGNOSIS — F17200 Nicotine dependence, unspecified, uncomplicated: Secondary | ICD-10-CM | POA: Insufficient documentation

## 2013-05-15 DIAGNOSIS — S0083XA Contusion of other part of head, initial encounter: Secondary | ICD-10-CM

## 2013-05-15 DIAGNOSIS — S81809A Unspecified open wound, unspecified lower leg, initial encounter: Secondary | ICD-10-CM

## 2013-05-15 DIAGNOSIS — S0003XA Contusion of scalp, initial encounter: Secondary | ICD-10-CM | POA: Insufficient documentation

## 2013-05-15 DIAGNOSIS — S1093XA Contusion of unspecified part of neck, initial encounter: Secondary | ICD-10-CM

## 2013-05-15 MED ORDER — OXYCODONE-ACETAMINOPHEN 5-325 MG PO TABS: 2.0000 | ORAL_TABLET | ORAL | Status: DC | PRN

## 2013-05-15 MED ORDER — PROMETHAZINE HCL 25 MG PO TABS: 25.0000 mg | ORAL_TABLET | Freq: Four times a day (QID) | ORAL | Status: DC | PRN

## 2013-05-15 MED ORDER — TETANUS-DIPHTH-ACELL PERTUSSIS 5-2.5-18.5 LF-MCG/0.5 IM SUSP
0.5000 mL | Freq: Once | INTRAMUSCULAR | Status: AC
  Administered 2013-05-15 (×2): 0.5 mL via INTRAMUSCULAR
  Filled 2013-05-15: qty 0.5

## 2013-05-15 MED ORDER — HYDROMORPHONE HCL PF 1 MG/ML IJ SOLN
2.0000 mg | Freq: Once | INTRAMUSCULAR | Status: AC
  Administered 2013-05-15 (×2): 2 mg via INTRAMUSCULAR
  Filled 2013-05-15: qty 2

## 2013-05-15 MED ORDER — HYDROMORPHONE HCL PF 1 MG/ML IJ SOLN
1.0000 mg | Freq: Once | INTRAMUSCULAR | Status: AC
  Administered 2013-05-15 (×2): 1 mg via INTRAMUSCULAR
  Filled 2013-05-15: qty 1

## 2013-05-15 MED ORDER — ONDANSETRON 4 MG PO TBDP
8.0000 mg | ORAL_TABLET | Freq: Once | ORAL | Status: AC
  Administered 2013-05-15 (×2): 8 mg via ORAL
  Filled 2013-05-15: qty 2

## 2013-05-15 NOTE — ED Notes (Signed)
Patient transported to X-ray 
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
## 2013-05-15 NOTE — ED Notes (Signed)
Pt comfortable with discharge and follow up instructions. Prescriptions x2. 

## 2013-05-15 NOTE — ED Notes (Signed)
Merrell, PA at bedside 
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
## 2013-05-16 NOTE — ED Provider Notes (Signed)
Medical screening examination/treatment/procedure(s) were performed by non-physician practitioner and as supervising physician I was immediately available for consultation/collaboration.   EKG Interpretation None       Casmere Hollenbeck K Linker, MD 05/16/13 0709 

## 2014-08-05 ENCOUNTER — Encounter (HOSPITAL_COMMUNITY): Payer: Self-pay | Admitting: *Deleted

## 2014-08-05 ENCOUNTER — Emergency Department (HOSPITAL_COMMUNITY)
Admission: EM | Admit: 2014-08-05 | Discharge: 2014-08-05 | Disposition: A | Discharge status: Home / Self Care | Payer: Federal, State, Local not specified - Other | Attending: Emergency Medicine | Admitting: Emergency Medicine

## 2014-08-05 ENCOUNTER — Inpatient Hospital Stay (HOSPITAL_COMMUNITY)
Admission: AD | Admit: 2014-08-05 | Discharge: 2014-08-11 | DRG: 885 | Disposition: A | Discharge status: Home / Self Care | Payer: Federal, State, Local not specified - Other | Source: Intra-hospital | Attending: Psychiatry | Admitting: Psychiatry

## 2014-08-05 ENCOUNTER — Emergency Department (HOSPITAL_COMMUNITY): Payer: No Typology Code available for payment source

## 2014-08-05 ENCOUNTER — Encounter (HOSPITAL_COMMUNITY): Payer: Self-pay

## 2014-08-05 DIAGNOSIS — F322 Major depressive disorder, single episode, severe without psychotic features: Secondary | ICD-10-CM | POA: Insufficient documentation

## 2014-08-05 DIAGNOSIS — F1210 Cannabis abuse, uncomplicated: Secondary | ICD-10-CM | POA: Insufficient documentation

## 2014-08-05 DIAGNOSIS — M79641 Pain in right hand: Secondary | ICD-10-CM

## 2014-08-05 DIAGNOSIS — K219 Gastro-esophageal reflux disease without esophagitis: Secondary | ICD-10-CM | POA: Diagnosis present

## 2014-08-05 DIAGNOSIS — F419 Anxiety disorder, unspecified: Secondary | ICD-10-CM | POA: Diagnosis present

## 2014-08-05 DIAGNOSIS — S6991XA Unspecified injury of right wrist, hand and finger(s), initial encounter: Secondary | ICD-10-CM | POA: Insufficient documentation

## 2014-08-05 DIAGNOSIS — M25561 Pain in right knee: Secondary | ICD-10-CM

## 2014-08-05 DIAGNOSIS — F919 Conduct disorder, unspecified: Secondary | ICD-10-CM | POA: Insufficient documentation

## 2014-08-05 DIAGNOSIS — T148 Other injury of unspecified body region: Secondary | ICD-10-CM | POA: Insufficient documentation

## 2014-08-05 DIAGNOSIS — F17200 Nicotine dependence, unspecified, uncomplicated: Secondary | ICD-10-CM | POA: Diagnosis present

## 2014-08-05 DIAGNOSIS — Y92410 Unspecified street and highway as the place of occurrence of the external cause: Secondary | ICD-10-CM | POA: Insufficient documentation

## 2014-08-05 DIAGNOSIS — F1310 Sedative, hypnotic or anxiolytic abuse, uncomplicated: Secondary | ICD-10-CM | POA: Insufficient documentation

## 2014-08-05 DIAGNOSIS — R52 Pain, unspecified: Secondary | ICD-10-CM

## 2014-08-05 DIAGNOSIS — Y9301 Activity, walking, marching and hiking: Secondary | ICD-10-CM | POA: Insufficient documentation

## 2014-08-05 DIAGNOSIS — S8991XA Unspecified injury of right lower leg, initial encounter: Secondary | ICD-10-CM | POA: Insufficient documentation

## 2014-08-05 DIAGNOSIS — F39 Unspecified mood [affective] disorder: Secondary | ICD-10-CM | POA: Diagnosis not present

## 2014-08-05 DIAGNOSIS — Y998 Other external cause status: Secondary | ICD-10-CM | POA: Insufficient documentation

## 2014-08-05 DIAGNOSIS — IMO0002 Reserved for concepts with insufficient information to code with codable children: Secondary | ICD-10-CM | POA: Insufficient documentation

## 2014-08-05 DIAGNOSIS — M25531 Pain in right wrist: Secondary | ICD-10-CM | POA: Diagnosis present

## 2014-08-05 DIAGNOSIS — G4700 Insomnia, unspecified: Secondary | ICD-10-CM | POA: Diagnosis present

## 2014-08-05 DIAGNOSIS — Z720 Tobacco use: Secondary | ICD-10-CM | POA: Insufficient documentation

## 2014-08-05 DIAGNOSIS — M5489 Other dorsalgia: Secondary | ICD-10-CM | POA: Insufficient documentation

## 2014-08-05 LAB — COMPREHENSIVE METABOLIC PANEL WITH GFR
ALT: 38 U/L (ref 17–63)
AST: 37 U/L (ref 15–41)
Albumin: 4.7 g/dL (ref 3.5–5.0)
Alkaline Phosphatase: 53 U/L (ref 38–126)
Anion gap: 11 (ref 5–15)
BUN: 21 mg/dL — ABNORMAL HIGH (ref 6–20)
CO2: 24 mmol/L (ref 22–32)
Calcium: 9.6 mg/dL (ref 8.9–10.3)
Chloride: 104 mmol/L (ref 101–111)
Creatinine, Ser: 1.34 mg/dL — ABNORMAL HIGH (ref 0.61–1.24)
GFR calc Af Amer: >60 mL/min
GFR calc non Af Amer: >60 mL/min
Glucose, Bld: 105 mg/dL — ABNORMAL HIGH (ref 65–99)
Potassium: 4 mmol/L (ref 3.5–5.1)
Sodium: 139 mmol/L (ref 135–145)
Total Bilirubin: 1.6 mg/dL — ABNORMAL HIGH (ref 0.3–1.2)
Total Protein: 7.7 g/dL (ref 6.5–8.1)

## 2014-08-05 LAB — CBC
HCT: 37.9 % — ABNORMAL LOW (ref 39.0–52.0)
Hemoglobin: 12.9 g/dL — ABNORMAL LOW (ref 13.0–17.0)
MCH: 30.5 pg (ref 26.0–34.0)
MCHC: 34 g/dL (ref 30.0–36.0)
MCV: 89.6 fL (ref 78.0–100.0)
Platelets: 257 K/uL (ref 150–400)
RBC: 4.23 MIL/uL (ref 4.22–5.81)
RDW: 12.9 % (ref 11.5–15.5)
WBC: 11.2 K/uL — ABNORMAL HIGH (ref 4.0–10.5)

## 2014-08-05 LAB — RAPID URINE DRUG SCREEN, HOSP PERFORMED
Amphetamines: NOT DETECTED
Barbiturates: NOT DETECTED
Benzodiazepines: POSITIVE — AB
Cocaine: NOT DETECTED
Opiates: NOT DETECTED
Tetrahydrocannabinol: POSITIVE — AB

## 2014-08-05 LAB — ETHANOL: Alcohol, Ethyl (B): <5 mg/dL

## 2014-08-05 LAB — SALICYLATE LEVEL: Salicylate Lvl: <4 mg/dL (ref 2.8–30.0)

## 2014-08-05 LAB — ACETAMINOPHEN LEVEL: Acetaminophen (Tylenol), Serum: <10 ug/mL — ABNORMAL LOW (ref 10–30)

## 2014-08-05 MED ORDER — LORAZEPAM 1 MG PO TABS
1.0000 mg | ORAL_TABLET | Freq: Once | ORAL | Status: AC
  Administered 2014-08-05 (×2): 1 mg via ORAL
  Filled 2014-08-05: qty 1

## 2014-08-05 MED ORDER — ACETAMINOPHEN 325 MG PO TABS
650.0000 mg | ORAL_TABLET | Freq: Once | ORAL | Status: AC
  Administered 2014-08-05 (×2): 650 mg via ORAL
  Filled 2014-08-05: qty 2

## 2014-08-05 MED ORDER — IBUPROFEN 600 MG PO TABS
600.0000 mg | ORAL_TABLET | Freq: Three times a day (TID) | ORAL | Status: DC | PRN
  Administered 2014-08-06 – 2014-08-10 (×14): 600 mg via ORAL
  Filled 2014-08-05 (×7): qty 1

## 2014-08-05 MED ORDER — CYCLOBENZAPRINE HCL 10 MG PO TABS
10.0000 mg | ORAL_TABLET | Freq: Three times a day (TID) | ORAL | Status: DC | PRN
  Administered 2014-08-05 – 2014-08-09 (×11): 10 mg via ORAL
  Filled 2014-08-05 (×5): qty 1

## 2014-08-05 MED ORDER — ONDANSETRON HCL 4 MG PO TABS: 4.0000 mg | ORAL_TABLET | Freq: Three times a day (TID) | ORAL | Status: DC | PRN

## 2014-08-05 MED ORDER — TRAMADOL-ACETAMINOPHEN 37.5-325 MG PO TABS: 2.0000 | ORAL_TABLET | Freq: Four times a day (QID) | ORAL | Status: DC | PRN

## 2014-08-05 MED ORDER — TRAZODONE HCL 50 MG PO TABS: 50.0000 mg | ORAL_TABLET | Freq: Every evening | ORAL | Status: DC | PRN

## 2014-08-05 MED ORDER — IBUPROFEN 200 MG PO TABS
600.0000 mg | ORAL_TABLET | Freq: Three times a day (TID) | ORAL | Status: DC | PRN
  Administered 2014-08-05 (×2): 600 mg via ORAL
  Filled 2014-08-05: qty 3

## 2014-08-05 MED ORDER — ACETAMINOPHEN 325 MG PO TABS
650.0000 mg | ORAL_TABLET | ORAL | Status: DC | PRN
  Administered 2014-08-09 – 2014-08-10 (×6): 650 mg via ORAL
  Filled 2014-08-05 (×3): qty 2

## 2014-08-05 MED ORDER — NICOTINE 21 MG/24HR TD PT24
21.0000 mg | MEDICATED_PATCH | Freq: Once | TRANSDERMAL | Status: AC
  Administered 2014-08-05 (×4): 21 mg via TRANSDERMAL
  Filled 2014-08-05: qty 1

## 2014-08-05 MED ORDER — BACITRACIN ZINC 500 UNIT/GM EX OINT
TOPICAL_OINTMENT | CUTANEOUS | Status: AC
  Administered 2014-08-05: 15:00:00
  Filled 2014-08-05: qty 0.9

## 2014-08-05 MED ORDER — HYDROXYZINE HCL 25 MG PO TABS
25.0000 mg | ORAL_TABLET | ORAL | Status: DC | PRN
  Administered 2014-08-06 – 2014-08-10 (×20): 25 mg via ORAL
  Filled 2014-08-05: qty 1
  Filled 2014-08-05: qty 20
  Filled 2014-08-05 (×10): qty 1

## 2014-08-05 MED ORDER — LORAZEPAM 1 MG PO TABS: 1.0000 mg | ORAL_TABLET | Freq: Three times a day (TID) | ORAL | Status: DC | PRN

## 2014-08-05 MED ORDER — TRAZODONE HCL 50 MG PO TABS
50.0000 mg | ORAL_TABLET | Freq: Every evening | ORAL | Status: DC | PRN
  Filled 2014-08-05: qty 1

## 2014-08-05 MED ORDER — NICOTINE 21 MG/24HR TD PT24
21.0000 mg | MEDICATED_PATCH | Freq: Every day | TRANSDERMAL | Status: DC
  Administered 2014-08-05 – 2014-08-11 (×14): 21 mg via TRANSDERMAL
  Filled 2014-08-05 (×3): qty 1
  Filled 2014-08-05: qty 14
  Filled 2014-08-05 (×5): qty 1
  Filled 2014-08-05: qty 14

## 2014-08-05 MED ORDER — CYCLOBENZAPRINE HCL 5 MG PO TABS: 7.5000 mg | ORAL_TABLET | Freq: Three times a day (TID) | ORAL | Status: DC | PRN

## 2014-08-05 MED ORDER — ALUM & MAG HYDROXIDE-SIMETH 200-200-20 MG/5ML PO SUSP: 30.0000 mL | ORAL | Status: DC | PRN

## 2014-08-05 MED ORDER — TRAZODONE HCL 50 MG PO TABS
50.0000 mg | ORAL_TABLET | Freq: Every evening | ORAL | Status: DC | PRN
  Administered 2014-08-06 – 2014-08-10 (×16): 50 mg via ORAL
  Filled 2014-08-05 (×2): qty 1
  Filled 2014-08-05: qty 28
  Filled 2014-08-05 (×5): qty 1

## 2014-08-05 MED ORDER — HYDROXYZINE HCL 25 MG PO TABS
25.0000 mg | ORAL_TABLET | ORAL | Status: DC | PRN
  Filled 2014-08-05: qty 1

## 2014-08-05 MED ORDER — CYCLOBENZAPRINE HCL 10 MG PO TABS
ORAL_TABLET | ORAL | Status: AC
  Administered 2014-08-05 (×2): 10 mg via ORAL
  Filled 2014-08-05: qty 1

## 2014-08-05 MED ORDER — NICOTINE 21 MG/24HR TD PT24
21.0000 mg | MEDICATED_PATCH | Freq: Every day | TRANSDERMAL | Status: DC
  Filled 2014-08-05 (×2): qty 1

## 2014-08-05 MED ORDER — ACETAMINOPHEN 325 MG PO TABS
650.0000 mg | ORAL_TABLET | ORAL | Status: DC | PRN
  Administered 2014-08-05 (×4): 650 mg via ORAL
  Filled 2014-08-05 (×2): qty 2

## 2014-08-05 NOTE — Tx Team (Addendum)
Initial Interdisciplinary Treatment Plan   PATIENT STRESSORS: Health problems Legal issue Substance abuse   PATIENT STRENGTHS: Average or above average intelligence Capable of independent living Communication skills   PROBLEM LIST: Problem List/Patient Goals Date to be addressed Date deferred Reason deferred Estimated date of resolution  Safety 08/05/2014     Aggression 08/05/2014     "Get pain medication" 08/05/2014     "Go home" 08/05/2014                                    DISCHARGE CRITERIA:  Improved stabilization in mood, thinking, and/or behavior Verbal commitment to aftercare and medication compliance Withdrawal symptoms are absent or subacute and managed without 24-hour nursing intervention  PRELIMINARY DISCHARGE PLAN: Attend 12-step recovery group Outpatient therapy Return to previous living arrangement  PATIENT/FAMIILY INVOLVEMENT: This treatment plan has been presented to and reviewed with the patient, Ronald Williams.  The patient and family have been given the opportunity to ask questions and make suggestions.  [name] 08/05/2014, 6:44 PM
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
## 2014-08-05 NOTE — ED Notes (Signed)
Pt's Brother can be reached at this  Number if needed [phone].

## 2014-08-05 NOTE — ED Notes (Signed)
[place] at bedside
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
## 2014-08-06 ENCOUNTER — Encounter (HOSPITAL_COMMUNITY): Payer: Self-pay | Admitting: Registered Nurse

## 2014-08-06 MED ORDER — LIDOCAINE 5 % EX PTCH
1.0000 | MEDICATED_PATCH | CUTANEOUS | Status: DC
  Administered 2014-08-06 – 2014-08-07 (×6): 1 via TRANSDERMAL
  Filled 2014-08-06 (×6): qty 1

## 2014-08-06 MED ORDER — ALBUTEROL SULFATE HFA 108 (90 BASE) MCG/ACT IN AERS
INHALATION_SPRAY | RESPIRATORY_TRACT | Status: AC
  Administered 2014-08-06 (×2): 2 via RESPIRATORY_TRACT
  Filled 2014-08-06: qty 6.7

## 2014-08-06 MED ORDER — ALBUTEROL SULFATE HFA 108 (90 BASE) MCG/ACT IN AERS
2.0000 | INHALATION_SPRAY | Freq: Four times a day (QID) | RESPIRATORY_TRACT | Status: DC | PRN
  Administered 2014-08-06 – 2014-08-10 (×21): 2 via RESPIRATORY_TRACT
  Filled 2014-08-06: qty 6.7

## 2014-08-06 MED ORDER — OXYCODONE-ACETAMINOPHEN 5-325 MG PO TABS
2.0000 | ORAL_TABLET | Freq: Once | ORAL | Status: AC
  Administered 2014-08-06 (×2): 1 via ORAL
  Filled 2014-08-06: qty 2

## 2014-08-06 MED ORDER — TRAMADOL HCL 50 MG PO TABS
50.0000 mg | ORAL_TABLET | Freq: Four times a day (QID) | ORAL | Status: DC
  Administered 2014-08-06 – 2014-08-08 (×19): 50 mg via ORAL
  Filled 2014-08-06 (×8): qty 1

## 2014-08-06 MED ORDER — TRAMADOL HCL 50 MG PO TABS
ORAL_TABLET | ORAL | Status: AC
  Administered 2014-08-06 (×2): 50 mg via ORAL
  Filled 2014-08-06: qty 1

## 2014-08-06 MED ORDER — ONDANSETRON HCL 4 MG PO TABS
4.0000 mg | ORAL_TABLET | Freq: Three times a day (TID) | ORAL | Status: DC | PRN
  Administered 2014-08-06 – 2014-08-08 (×10): 4 mg via ORAL
  Filled 2014-08-06 (×5): qty 1

## 2014-08-06 MED ORDER — GABAPENTIN 400 MG PO CAPS
400.0000 mg | ORAL_CAPSULE | Freq: Three times a day (TID) | ORAL | Status: DC
  Administered 2014-08-07 – 2014-08-11 (×28): 400 mg via ORAL
  Filled 2014-08-06: qty 1
  Filled 2014-08-06: qty 42
  Filled 2014-08-06 (×4): qty 1
  Filled 2014-08-06: qty 42
  Filled 2014-08-06 (×2): qty 1
  Filled 2014-08-06: qty 42
  Filled 2014-08-06 (×5): qty 1
  Filled 2014-08-06: qty 42
  Filled 2014-08-06 (×4): qty 1
  Filled 2014-08-06: qty 42
  Filled 2014-08-06: qty 1
  Filled 2014-08-06: qty 42
  Filled 2014-08-06 (×2): qty 1

## 2014-08-06 MED ORDER — ALBUTEROL SULFATE HFA 108 (90 BASE) MCG/ACT IN AERS: 2.0000 | INHALATION_SPRAY | Freq: Four times a day (QID) | RESPIRATORY_TRACT | Status: DC
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
## 2014-08-06 NOTE — [facility] Group Notes (Signed)
[facility] Group Notes: (Clinical Social Work)   08/06/2014      Type of Therapy:  Group Therapy   Participation Level:  Did Not Attend despite MHT, CSW and other patients prompting.  Was very resistant.   Ambrose Mantle, LCSW 08/06/2014, 4:52 PM
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
## 2014-08-06 NOTE — Progress Notes (Signed)
Did not attend group 

## 2014-08-07 ENCOUNTER — Inpatient Hospital Stay (HOSPITAL_COMMUNITY)
Admission: AD | Admit: 2014-08-07 | Discharge: 2014-08-07 | Disposition: A | Discharge status: Home / Self Care | Payer: Federal, State, Local not specified - Other | Source: Intra-hospital | Attending: Registered Nurse | Admitting: Registered Nurse

## 2014-08-07 DIAGNOSIS — M5489 Other dorsalgia: Secondary | ICD-10-CM | POA: Insufficient documentation
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
## 2014-08-07 NOTE — [facility] Group Notes (Signed)
[facility] Group Notes:  (Nursing/MHT/Case Management/Adjunct)  Date:  08/07/2014  Time:  12:28 PM  Type of Therapy:  Psychoeducational Skills  Participation Level:  Active  Participation Quality:  Intrusive and Monopolizing  Affect:  Irritable and Labile  Cognitive:  Disorganized  Insight:  Lacking and Limited  Engagement in Group:  Lacking and Limited  Modes of Intervention:  Discussion, Education and Exploration  Summary of Progress/Problems:  Ronald Williams 08/07/2014, 12:28 PM

## 2014-08-07 NOTE — [facility] Group Notes (Signed)
[facility] Group Notes:  (Clinical Social Work)  08/07/2014  1:15-2:15PM  Summary of Progress/Problems:   The main focus of today's process group was to   1)  discuss the importance of adding supports  2)  define health supports versus unhealthy supports  3)  identify the patient's current unhealthy supports and plan how to handle them  4)  Identify the patient's current healthy supports and plan what to add.  The patient was intrusive, monopolizing, angry and inattentive when anyone other than himself was talking.  He bonded with another angry patient over how terrible doctors have been to him, was not able to hear anything positive that was shared in group, refused to be redirected to more appropriate means to find help for his problems.  He kept initiating side conversations with the other angry person, and did not try to speak quietly at all, so CSW asked for them to stop, at which point they both got up and left angrily, noisily and with much cursing.    Type of Therapy:  Process Group with Motivational Interviewing  Participation Level:  Active  Participation Quality:  Intrusive, Inattentive, Monopolizing, Resistant and angry and inappropriate  Affect:  Angry  Cognitive:  Alert  Insight:  None  Engagement in Therapy:  Limited  Modes of Intervention:   Education, Support and Processing, Activity  Ambrose Mantle, LCSW 08/07/2014
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
## 2014-08-08 DIAGNOSIS — M25531 Pain in right wrist: Secondary | ICD-10-CM | POA: Diagnosis present

## 2014-08-08 MED ORDER — GABAPENTIN 400 MG PO CAPS
400.0000 mg | ORAL_CAPSULE | Freq: Every day | ORAL | Status: DC
  Filled 2014-08-08 (×2): qty 1

## 2014-08-08 MED ORDER — HYDROCODONE-ACETAMINOPHEN 5-325 MG PO TABS
1.0000 | ORAL_TABLET | Freq: Four times a day (QID) | ORAL | Status: DC | PRN
  Administered 2014-08-08 – 2014-08-11 (×14): 1 via ORAL
  Filled 2014-08-08 (×7): qty 1

## 2014-08-08 MED ORDER — LIDOCAINE 5 % EX PTCH
2.0000 | MEDICATED_PATCH | CUTANEOUS | Status: DC
  Administered 2014-08-09 – 2014-08-10 (×4): 2 via TRANSDERMAL
  Filled 2014-08-08 (×4): qty 2
  Filled 2014-08-08 (×2): qty 28

## 2014-08-08 MED ORDER — PANTOPRAZOLE SODIUM 40 MG PO TBEC
40.0000 mg | DELAYED_RELEASE_TABLET | Freq: Every day | ORAL | Status: DC
  Administered 2014-08-08 – 2014-08-11 (×8): 40 mg via ORAL
  Filled 2014-08-08 (×6): qty 1
  Filled 2014-08-08 (×2): qty 14

## 2014-08-08 NOTE — Progress Notes (Signed)
Patient ID: Ronald Williams, male   DOB: 05-06-1965, 50 y.o.   MRN: [id number]   Pt currently presents with a flat affect and labile, intrusive behavior. Pt was talking to another pt and she was not responding, pt states "You aren't dealing with anything, you are pushing it all down." Pt argumentative with staff.  Per self inventory, pt rates depression at a 0, hopelessness 0 and anxiety 2. Pt's daily goal is to "roll with the flow" and they intend to do so by "be calm, cool." Pt reports good sleep, good concentration, high energy and a good appetite. Pt given an ice pack and ripped the side exposing the plastic bag. Pt complains of back pain, knee pain and right hand pain.  Pt provided with medications per providers orders. Pt's labs and vitals were monitored throughout the day. Pt supported emotionally and encouraged to express concerns and questions. Pt educated on medications and proper cold treatment administration. Pt instructed to and does return the ice pack to nurses station post use.   Pt's safety ensured with 15 minute and environmental checks. Pt currently denies SI/HI and A/V hallucinations. Pt verbally agrees to seek staff if SI/HI or A/VH occurs and to consult with staff before acting on these thoughts. Will continue POC. Pt states "the ice pack helps, I just need three one for my back, my hand and my knee." Pt also reports that "acting like I did when I got here! Being mean, not listening!!!" would keep him from his discharge plans.

## 2014-08-08 NOTE — Progress Notes (Signed)
Pt transported to [facility] by [facility] and escorted by [employer].for x-ray.

## 2014-08-08 NOTE — Progress Notes (Signed)
[facility] Group Notes:  (Nursing/MHT/Case Management/Adjunct)  Date:  08/08/2014  Time:  8:52 PM  Type of Therapy:  Psychoeducational Skills  Participation Level:  Active  Participation Quality:  Appropriate  Affect:  Appropriate  Cognitive:  Appropriate  Insight:  Appropriate  Engagement in Group:  Engaged  Modes of Intervention:  Discussion  Summary of Progress/Problems: Today in wrap up group Ronald Williams said that his day was an overall 8. He mentioned that one of his stressors was making sure that his rent was paid for his home. Something positive that came out of the day was his rent would be covered, he was able to meet people here that he could network with, and he was going to be discharged soon.  Madaline Savage 08/08/2014, 8:52 PM
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
## 2014-08-08 NOTE — [facility] Group Notes (Signed)
[facility] LCSW Group Therapy          Overcoming Obstacles       1:15 -2:30        08/08/2014       Type of Therapy:  Group Therapy  Participation Level:  Appropriate  Participation Quality:  Appropriate  Affect:  Appropriate, Alert  Cognitive:  Attentive Appropriate  Insight: Developing/Improving Engaged  Engagement in Therapy: Developing/Imprvoing Engaged  Modes of Intervention:  Discussion Exploration  Education Rapport BuildingProblem-Solving Support  Summary of Progress/Problems:  The main focus of today's group was overcoming obstacles. He shared anger is the obstacle he needs to overcome. He shared he planned to take an anger management class at discharge. He was given contact information for [facility] and advised him there is no cost for there services.   Wynn Banker 08/08/2014

## 2014-08-08 NOTE — Progress Notes (Addendum)
Patient ID: Ronald Williams, male   DOB: 27-Jul-1965, 49 y.o.   MRN: [id number]  Pt denies allergy to Hydrocodone per MD consult. Post hydrocodone administration and re-assessment, pt states "My pain is 9, it's still bad, that was only 5mg , I think I need 10." Pt has been skipping and walking down the hallways, pacing often throughout the hall today. Pt makes delusional statements about pain treatment states, If I keep walking, I get my blood pumping and it makes the medicine flow through my body quicker, I can feel it in my body." Pt restless and endorsing sweats.

## 2014-08-08 NOTE — Clinical Social Work Note (Signed)
CSW met with patient who refused to sign consent for follow up.  He did advise of interest in an anger management class and was given information on [facility].  He was advised they offer a free anger management program.

## 2014-08-08 NOTE — Progress Notes (Signed)
During wrap-up group pt stated that he had a rough morning but progressed throughout the day and ended up having a great day because he realized that he needed to make an attitude adjustment and there was no need to get upset.  Pt states, "the Lord put me in here to help someone else because I don't need to be here".  When asked how he was going to help others pt stated, " I met another pt on another hall that needs help and has nowhere to live, he's homeless, and I'm going to give him a place to stay by letting him come stay with me.  The Lord put me here for a reason and I see that now, he's working through me".  Pt states that his goal for today was to go get his back x-rayed.   Rick Duff, MHT
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
## 2014-08-08 NOTE — Progress Notes (Signed)
[facility]  08/08/2014 7:47 PM Ronald Williams  MRN:  [id number] Subjective:  Ronald Williams has been agitated focused on the pain in his arm, wrist, back. He shares the events that happened before he came here. Since his admission he has evidenced pressured speech, poor boundaries. He does not know what is going on with him, he does not completely deny that he might have a mood disorder Principal Problem: Mood disorder Diagnosis:   Patient Active Problem List   Diagnosis Date Noted  . Other back pain [M54.89]   . Mood disorder [F39] 08/05/2014  . Behavioral disorder [F91.9]   . 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 No pertinent past medical history.  Past Surgical History  Procedure Laterality Date  . Carpal tunnel release    . Back surgery     Family History: History reviewed. No pertinent family history. Social History:  History  Alcohol Use  . Yes    Comment: social drinker     History  Drug Use No    History   Social History  . Marital Status: Divorced    Spouse Name: N/A  . Number of Children: N/A  . Years of Education: N/A   Social History Main Topics  . Smoking status: Current Every Day Smoker  . Smokeless tobacco: Not on file  . Alcohol Use: Yes     Comment: social drinker  . Drug Use: No  . Sexual Activity: Not on file   Other Topics Concern  . None   Social History Narrative   Additional History:    Sleep: Fair  Appetite:  Fair   Assessment:   Musculoskeletal: Strength & Muscle Tone: within normal limits Gait & Station: normal Patient leans: normal   Psychiatric Specialty Exam: Physical Exam  Review of Systems  Constitutional: Negative.   HENT: Negative.   Eyes: Negative.   Respiratory: Negative.   Cardiovascular: Negative.   Gastrointestinal: Negative.   Genitourinary: Negative.    Musculoskeletal: Positive for joint pain.  Skin: Negative.   Neurological: Negative.   Endo/Heme/Allergies: Negative.   Psychiatric/Behavioral: Positive for depression. The patient is nervous/anxious and has insomnia.     Blood pressure 123/80, pulse 87, temperature 98.2 F (36.8 C), temperature source Oral, resp. rate 20, height 6' (1.829 m), weight 55.792 kg (123 lb), SpO2 97 %.Body mass index is 16.68 kg/(m^2).  General Appearance: Fairly Groomed  Patent attorneyye Contact::  Fair  Speech:  Clear and Coherent and Pressured  Volume:  fluctuates getting loud at times  Mood:  Angry, Anxious and Irritable  Affect:  Labile  Thought Process:  Coherent and Goal Directed  Orientation:  Full (Time, Place, and Person)  Thought Content:  events that happened, symptoms worries concerns  Suicidal Thoughts:  No  Homicidal Thoughts:  No  Memory:  Immediate;   Fair Recent;   Fair Remote;   Fair  Judgement:  Fair  Insight:  Shallow  Psychomotor Activity:  Restlessness  Concentration:  Fair  Recall:  FiservFair  Fund of Knowledge:Fair  Language: Fair  Akathisia:  No  Handed:  Right  AIMS (if indicated):     Assets:  Desire for Improvement  ADL's:  Intact  Cognition: WNL  Sleep:  Number of Hours: 6.25     Current Medications: Current Facility-Administered Medications  Medication Dose Route Frequency Provider Last Rate Last Dose  . acetaminophen (TYLENOL) tablet  650 mg  650 mg Oral Q4H PRN Earney Navy, NP      . albuterol (PROVENTIL HFA;VENTOLIN HFA) 108 (90 BASE) MCG/ACT inhaler 2 puff  2 puff Inhalation Q6H PRN Shuvon B Rankin, NP   2 puff at 08/08/14 1610  . alum & mag hydroxide-simeth (MAALOX/MYLANTA) 200-200-20 MG/5ML suspension 30 mL  30 mL Oral PRN Earney Navy, NP      . cyclobenzaprine (FLEXERIL) tablet 10 mg  10 mg Oral Q8H PRN Rachael Fee, MD   10 mg at 08/07/14 2223  . gabapentin (NEURONTIN) capsule 400 mg  400 mg Oral TID Charm Rings, NP   400 mg at 08/08/14 1637  .  gabapentin (NEURONTIN) capsule 400 mg  400 mg Oral QHS Rachael Fee, MD      . HYDROcodone-acetaminophen (NORCO/VICODIN) 5-325 MG per tablet 1 tablet  1 tablet Oral Q6H PRN Rachael Fee, MD   1 tablet at 08/08/14 1637  . hydrOXYzine (ATARAX/VISTARIL) tablet 25 mg  25 mg Oral Q4H PRN Earney Navy, NP   25 mg at 08/07/14 2223  . ibuprofen (ADVIL,MOTRIN) tablet 600 mg  600 mg Oral Q8H PRN Earney Navy, NP   600 mg at 08/07/14 0542  . lidocaine (LIDODERM) 5 % 2 patch  2 patch Transdermal Q24H Rachael Fee, MD      . nicotine (NICODERM CQ - dosed in mg/24 hours) patch 21 mg  21 mg Transdermal Daily Earney Navy, NP   21 mg at 08/08/14 0810  . ondansetron (ZOFRAN) tablet 4 mg  4 mg Oral Q8H PRN Charm Rings, NP   4 mg at 08/08/14 1945  . pantoprazole (PROTONIX) EC tablet 40 mg  40 mg Oral Daily Sanjuana Kava, NP   40 mg at 08/08/14 1636  . traZODone (DESYREL) tablet 50 mg  50 mg Oral QHS PRN Worthy Flank, NP   50 mg at 08/07/14 2353    Lab Results: No results found for this or any previous visit (from the past 48 hour(s)).  Physical Findings: AIMS: Facial and Oral Movements Muscles of Facial Expression: None, normal Lips and Perioral Area: None, normal Jaw: None, normal Tongue: None, normal,Extremity Movements Upper (arms, wrists, hands, fingers): None, normal Lower (legs, knees, ankles, toes): None, normal, Trunk Movements Neck, shoulders, hips: None, normal, Overall Severity Severity of abnormal movements (highest score from questions above): None, normal Incapacitation due to abnormal movements: None, normal Patient's awareness of abnormal movements (rate only patient's report): No Awareness, Dental Status Current problems with teeth and/or dentures?: No Does patient usually wear dentures?: No  CIWA:  CIWA-Ar Total: 5 COWS:     Treatment Plan Summary: Daily contact with patient to assess and evaluate symptoms and progress in treatment and Medication  management Supportive approach/coping skills Pain; [name] is asking for a low dose of Hydrocodone. States the pain is keeping him agitated. States he thinks he could settle down if the pain was less. Will order Hydrocodone 5 mg Q 6 HRS PRN Mood instability; will evaluate further. Will consider a mood stabilizer. Meanwhile will increase the Neurontin to 400 mg QID Will work with CBT/mindfulness/anger management  Medical Decision Making:  Review of Psycho-Social Stressors (1), Review or order clinical lab tests (1), Review of Medication Regimen & Side Effects (2) and Review of New Medication or Change in Dosage (2)     Kimber Fritts A 08/08/2014, 7:47 PM

## 2014-08-08 NOTE — Plan of Care (Signed)
Problem: Aggression Towards others,Towards Self, and or Destruction Goal: STG-Patient will comply with prescribed medication regimen (Patient will comply with prescribed medication regimen)  Outcome: Progressing Pt has been irritable about his care but can be redirected.

## 2014-08-08 NOTE — Progress Notes (Signed)
Recreation Therapy Notes  Date: 06.27.16 Time: 9:30 am Location: [address] Dayroom  Group Topic: Stress Management  Goal Area(s) Addresses:  Patient will verbalize importance of using healthy stress management.  Patient will identify positive emotions associated with healthy stress management.   Intervention: Stress Management  Activity :  Guided Imagery Script.  LRT introduced and educated patients on stress management technique of guided imagery.  A script was used to to deliver the technique to patients.  Patients were asked to follow script read aloud by LRT to engage in the stress management technique.  Education:  Stress Management, Discharge Planning.   Clinical Observations/Feedback: Patient did not attend group.  Tanica Gaige , LRT/CTRS         Zaineb Nowaczyk A 08/08/2014 5:10 PM 

## 2014-08-08 NOTE — Progress Notes (Signed)
Patient ID: Ronald Williams, male   DOB: [birth year], 49 y.o.   MRN: [id number] D: Patient irritable reports no relieve from pain medication. Pt reports pain as 11 on a 0-10 scale. Pt not happy about reasons why he was admitted. Pt mood and affect appeared angry and sad.  Pt denies SI/HI/AVH. No acute distressed noted at this time.   A: Met with pt 1:1. Medications administered as prescribed. Support and encouragement provider to attend groups and engage in milieu. Pt encouraged to discuss feelings and come to staff with any question or concerns.   R: Patient is complaint with medications.

## 2014-08-09 DIAGNOSIS — F39 Unspecified mood [affective] disorder: Secondary | ICD-10-CM

## 2014-08-09 MED ORDER — FLUTICASONE PROPIONATE 50 MCG/ACT NA SUSP
2.0000 | Freq: Every day | NASAL | Status: DC
  Administered 2014-08-09 – 2014-08-11 (×6): 2 via NASAL
  Filled 2014-08-09 (×2): qty 16

## 2014-08-09 MED ORDER — DIVALPROEX SODIUM ER 250 MG PO TB24
250.0000 mg | ORAL_TABLET | Freq: Two times a day (BID) | ORAL | Status: DC
  Administered 2014-08-09 – 2014-08-10 (×6): 250 mg via ORAL
  Filled 2014-08-09 (×8): qty 1

## 2014-08-09 NOTE — Tx Team (Signed)
Interdisciplinary Treatment Plan Update (Adult)  Date:  08/09/2014  Time Reviewed:  9:00 AM   Progress in Treatment: Attending groups: Patient is attending groups. Participating in groups:  Patient engages in discussion Taking medication as prescribed:  Patient is taking medications Tolerating medication:  Patient is tolerating medications Family/Significant othe contact made:   No, patient declined collateral contact Patient understands diagnosis:Yes, patient understands diagnosis and need for treatment Discussing patient identified problems/goals with staff:  Yes, patient is able to express goals/problems Medical problems stabilized or resolved:  Yes Denies suicidal/homicidal ideation: Yes, patient is denying SI/HI. Issues/concerns per patient self-inventory:   Other:  Discharge Plan or Barriers:  Home - patient has refused follow up but states [facility]  Reason for Continuation of Hospitalization: Anxiety Depression Medication stabilization  Comments:   Additional comments:  Patient and CSW reviewed Patient Discharge Process Letter/Patient Involvement Form.  Patient verbalized understanding and signed form.  Patient and CSW also reviewed and identified patient's goals and treatment plan.  Patient verbalized understanding and agreed to plan.  Estimated length of stay: 1-2 days  New goal(s):  Review of initial/current patient goals per problem list:  Please see plan of careInterdisciplinary Treatment Plan Update (Adult)  Attendees: Patient 08/09/2014 9:00 AM   Family:   08/09/2014 9:00 AM   Physician:  Nehemiah MassedFernando Cobos, MD 08/09/2014 9:00 AM   Nursing:   Earl ManySara Twyman, RN 08/09/2014 9:00 AM   Clinical Social Worker:  Juline PatchQuylle Annahi Short, LCSW 08/09/2014 9:00 AM   Clinical Social Worker:  Belenda CruiseKristin Drinkard, LCSW-A 08/09/2014 9:00 AM   Case Manager:  Onnie BoerJennifer Clark, RN 08/09/2014 9:00 AM   Other:  Liborio NixonPatrice White, RN  08/09/2014 9:00 AM  Other:   08/09/2014  9:00 AM   Other:  08/09/2014 9:00 AM   Other:  08/09/2014 9:00 AM   Other:  08/09/2014 9:00 AM   Other:  Chad CordialValerie Enoch, [facility] Transition Team Coordinator 08/09/2014 9:00 AM   Other:   08/09/2014 9:00 AM   Other:  08/09/2014 9:00 AM   Other:   08/09/2014 9:00 AM    Scribe for Treatment Team:   Wynn BankerHodnett, Devell Parkerson Hairston, 08/09/2014   9:00 AM
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
## 2014-08-09 NOTE — Progress Notes (Signed)
Adult Psychoeducational Group Note  Date:  08/09/2014 Time:  0900  Group Topic/Focus:  Orientation:   The focus of this group is to educate the patient on the purpose and policies of crisis stabilization and provide a format to answer questions about their admission.  The group details unit policies and expectations of patients while admitted.  Participation Level:  Active  Participation Quality:  Appropriate  Affect:  Appropriate  Cognitive:  Appropriate  Insight: Appropriate  Engagement in Group:  Engaged  Modes of Intervention:  Education  Additional Comments:    Galen Malkowski L 08/09/2014, 2:10 PM

## 2014-08-09 NOTE — Progress Notes (Signed)
Montrae did not attend group tonight he was sleep in his bed.

## 2014-08-09 NOTE — [facility] Group Notes (Signed)
[facility] LCSW Group Therapy      Feelings About Diagnosis 1:15 - 2:30 PM         08/09/2014 2:45 PM    Type of Therapy:  Group Therapy  Participation Level:  Active  Participation Quality:  Appropriate  Affect:  Appropriate  Cognitive:  Alert and Appropriate  Insight:  Developing/Improving and Engaged  Engagement in Therapy:  Developing/Improving and Engaged  Modes of Intervention:  Discussion, Education, Exploration, Problem-Solving, Rapport Building, Support  Summary of Progress/Problems:  Patient actively participated in group. Patient discussed past and present diagnosis and the effects it has had on  life.  Patient talked about family and society being judgmental and the stigma associated with having a mental health diagnosis.  Patient shared he accepts his diagnosis and the need to be in the hospital. He advised it has been years since he has been on medications.  Wynn BankerHodnett, Kymir Coles Hairston 08/09/2014  2:45 PM

## 2014-08-09 NOTE — Progress Notes (Signed)
Patient ID: Ronald Williams, male   DOB: 1965-07-16, 49 y.o.   MRN: [id number]   D: At times pt is labile and intrusive. Informed the writer that he was experiencing "hot flashes" today and thinks it's due to the neurontin. Pt also informed the writer that he was informed that he would be able to get more vicodin tonight by "verbal order". However, when informed that he would need to speak with his dr, pt accepted the info without incidence.   A:  Support and encouragement was offered. 15 min checks continued for safety.  R: Pt remains safe.

## 2014-08-09 NOTE — Progress Notes (Signed)
Recreation Therapy Notes  Animal-Assisted Activity (AAA) Program Checklist/Progress Notes Patient Eligibility Criteria Checklist & Daily Group note for Rec Tx Intervention  Date: 06.28.16 Time: 2:45 pm Location: [address] Dayroom  AAA/T Program Assumption of Risk Form signed by Patient/ or Parent Legal Guardian yes  Patient is free of allergies or sever asthma yes  Patient reports no fear of animals yes  Patient reports no history of cruelty to animalsyes  Patient understands his/her participation is voluntary yes  Patient washes hands before animal contact yes  Patient washes hands after animal contact yes  Behavioral Response: Engaged  Education: Hand Washing, Appropriate Animal Interaction   Education Outcome: Acknowledges understanding/In group clarification offered  Clinical Observations/Feedback: Patient attended group.   Shirleymae Hauth, LRT/CTRS         [place] Zou A 08/09/2014 4:02 PM 

## 2014-08-10 LAB — BASIC METABOLIC PANEL WITH GFR
Anion gap: 9 (ref 5–15)
BUN: 23 mg/dL — ABNORMAL HIGH (ref 6–20)
CO2: 28 mmol/L (ref 22–32)
Calcium: 9.7 mg/dL (ref 8.9–10.3)
Chloride: 104 mmol/L (ref 101–111)
Creatinine, Ser: 1.06 mg/dL (ref 0.61–1.24)
GFR calc Af Amer: >60 mL/min
GFR calc non Af Amer: >60 mL/min
Glucose, Bld: 100 mg/dL — ABNORMAL HIGH (ref 65–99)
Potassium: 4.6 mmol/L (ref 3.5–5.1)
Sodium: 141 mmol/L (ref 135–145)

## 2014-08-10 MED ORDER — DIVALPROEX SODIUM ER 500 MG PO TB24
500.0000 mg | ORAL_TABLET | Freq: Two times a day (BID) | ORAL | Status: DC
  Administered 2014-08-10 – 2014-08-11 (×4): 500 mg via ORAL
  Filled 2014-08-10: qty 1
  Filled 2014-08-10 (×3): qty 28
  Filled 2014-08-10 (×2): qty 1
  Filled 2014-08-10: qty 28
  Filled 2014-08-10: qty 1

## 2014-08-10 MED ORDER — LORATADINE 10 MG PO TABS
10.0000 mg | ORAL_TABLET | Freq: Every day | ORAL | Status: DC
  Administered 2014-08-10 – 2014-08-11 (×4): 10 mg via ORAL
  Filled 2014-08-10: qty 14
  Filled 2014-08-10 (×2): qty 1
  Filled 2014-08-10: qty 14
  Filled 2014-08-10 (×2): qty 1

## 2014-08-10 NOTE — Plan of Care (Signed)
Problem: Aggression Towards others,Towards Self, and or Destruction Goal: LTG - No aggression,physical/verbal/destruction prior to D/C (Patient will have no episodes of physical or verbal aggression or property destruction towards self or others for _____ day (s) prior to discharge.)  Outcome: Not Progressing Pt states "I need this medicine, I don't want to take it, this is ridiculous sweetheart, okay? This place is ridiculous. You're all idiots."

## 2014-08-10 NOTE — [facility] Group Notes (Signed)
[facility] LCSW Group Therapy  Emotional Regulation 1:15 - 2: 30 PM        08/10/2014     Type of Therapy:  Group Therapy  Participation Level:  Appropriate  Participation Quality:  Appropriate  Affect:  Appropriate  Cognitive:  Attentive Appropriate  Insight:  Developing/Improving Engaged  Engagement in Therapy:  Developing/Improving Engaged  Modes of Intervention:  Discussion Exploration Problem-Solving Supportive  Summary of Progress/Problems:  Group topic was emotional regulations.  Patient participated in the discussion and was able to identify anger as the emotion he needs to regulate.  He plans to get involved in an anger management class at discharge.  Wynn BankerHodnett, Tashira Torre Hairston 08/10/2014

## 2014-08-10 NOTE — [facility] Group Notes (Signed)
[facility]   08/10/2014 12:26 PM    Participation Quality:  Appropraite  Mood/Affect:  Appropriate  Depression Rating:  1  Anxiety Rating:  1  Thoughts of Suicide:  No  Will you contract for safety?   NA  Current AVH:  No  Plan for Discharge/Comments:  Patient attended discharge planning group and actively participated in group. He reports being much better and hopes to discharge home tomorrow.  Patient is refusing follow up at this time.  Suicide prevention education reviewed and SPE document provided.   Transportation Means: Patient has transportation.   Supports:  Patient has a support system.   Averiana Clouatre, Joesph JulyQuylle Hairston
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
## 2014-08-10 NOTE — Progress Notes (Signed)
Patient ID: Ronald Williams, male   DOB: 08-26-1965, 49 y.o.   MRN: [id number]   Pt currently presents with an animated affect and anxious behavior. Per self inventory, pt rates depression at a 1, hopelessness 0 and anxiety 1. Pt's daily goal is to "being cool" and they intend to do so by "be calm." Pt reports good sleep, good concentration, normal energy and a good appetite. Pt c/o pain throughout the day. Pt seen pacing the hallways and playing basketball/"shooting hoops" at recreation time.   Pt provided with medications per providers orders. Pt's labs and vitals were monitored throughout the day. Pt supported emotionally and encouraged to express concerns and questions. Pt educated on medications and pain management.   Pt's safety ensured with 15 minute and environmental checks. Pt currently denies SI/HI and A/V hallucinations. Pt verbally agrees to seek staff if SI/HI or A/VH occurs and to consult with staff before acting on these thoughts. Pt focused on receiving medications. Pt complains of chest/neck pain, upon further assessment pt states "I have drainage, it's scratchy." NP notified. Loratadine order placed. Will continue POC.

## 2014-08-10 NOTE — Progress Notes (Signed)
Adult Psychoeducational Group Note  Date:  08/10/2014 Time:  9:28 PM  Group Topic/Focus:  Wrap-Up Group:   The focus of this group is to help patients review their daily goal of treatment and discuss progress on daily workbooks.  Participation Level:  Active  Participation Quality:  Appropriate  Affect:  Appropriate  Cognitive:  Appropriate  Insight: Appropriate  Engagement in Group:  Engaged  Modes of Intervention:  Discussion  Additional Comments: The patient expressed that he attended group.The patient also said that he had a very good day.  Octavio Mannshigpen, Lonia Roane Lee 08/10/2014, 9:28 PM

## 2014-08-10 NOTE — Progress Notes (Signed)
Recreation Therapy Notes  Date: 06.29.16 Time: 9:30 am Location: [address] Dayroom  Group Topic: Stress Management  Goal Area(s) Addresses:  Patient will verbalize importance of using healthy stress management.  Patient will identify positive emotions associated with healthy stress management.   Behavioral Response: Engaged  Intervention: Stress Management  Activity : Progressive Muscle Relaxation. LRT introduced and educated patients on stress management technique of progressive muscle relaxation. A script was used to to deliver the technique to patients. Patients were asked to follow script read aloud by LRT to engage in the stress management technique.  Education: Stress Management, Discharge Planning.   Education Outcome: Acknowledges edcuation/In group clarification offered/Needs additional education  Clinical Observations/Feedback: Patient attended group.  Caroll RancherMarjette Nishika Parkhurst , LRT/CTRS        Caroll RancherLindsay, Axl Rodino A 08/10/2014 3:09 PM

## 2014-08-11 ENCOUNTER — Encounter (HOSPITAL_COMMUNITY): Payer: Self-pay | Admitting: Registered Nurse

## 2014-08-11 MED ORDER — ALBUTEROL SULFATE HFA 108 (90 BASE) MCG/ACT IN AERS: 2.0000 | INHALATION_SPRAY | Freq: Four times a day (QID) | RESPIRATORY_TRACT | Status: DC | PRN

## 2014-08-11 MED ORDER — HYDROXYZINE HCL 25 MG PO TABS: 25.0000 mg | ORAL_TABLET | Freq: Three times a day (TID) | ORAL | Status: DC | PRN

## 2014-08-11 MED ORDER — DIVALPROEX SODIUM ER 500 MG PO TB24: 500.0000 mg | ORAL_TABLET | Freq: Two times a day (BID) | ORAL | Status: AC

## 2014-08-11 MED ORDER — LORATADINE 10 MG PO TABS: 10.0000 mg | ORAL_TABLET | Freq: Every day | ORAL | Status: DC

## 2014-08-11 MED ORDER — NICOTINE 21 MG/24HR TD PT24: 21.0000 mg | MEDICATED_PATCH | Freq: Every day | TRANSDERMAL | Status: DC

## 2014-08-11 MED ORDER — FLUTICASONE PROPIONATE 50 MCG/ACT NA SUSP: 2.0000 | Freq: Every day | NASAL | Status: DC | PRN

## 2014-08-11 MED ORDER — TRAZODONE HCL 50 MG PO TABS: 50.0000 mg | ORAL_TABLET | Freq: Every evening | ORAL | Status: AC | PRN

## 2014-08-11 MED ORDER — GABAPENTIN 400 MG PO CAPS: 400.0000 mg | ORAL_CAPSULE | Freq: Three times a day (TID) | ORAL | Status: DC

## 2014-08-11 MED ORDER — LIDOCAINE 5 % EX PTCH: 2.0000 | MEDICATED_PATCH | CUTANEOUS | Status: DC

## 2014-08-11 MED ORDER — PANTOPRAZOLE SODIUM 40 MG PO TBEC: 40.0000 mg | DELAYED_RELEASE_TABLET | Freq: Every day | ORAL | Status: AC

## 2014-08-11 NOTE — Progress Notes (Signed)
D: Pt at this time is alert and oreientedx3. Denies any form of depression and anxiety, denies SI/HI. Pt is however very animated during conversation with nurse. Pt c/o of sever pain of 8 on a 0-10 scale. Pt was seen having appropriate conversation with peers and staffs. A: Medications administered as prescribed. Support, encouragement and safe environment offered. 15 minute safety checks continue. R: Pt was med compliant. Safety checks continue.

## 2014-08-11 NOTE — Progress Notes (Signed)
  [facility] :  Will you be returning to the same living situation after discharge:  Yes,  Patient is returning to his home. At discharge, do you have transportation home?: Yes, [facility] Do you have the ability to pay for your medications: No.  Patient will need assistance with indigent medications.  Release of information consent forms completed and in the chart;  Patient's signature needed at discharge.  Patient to Follow up at: Follow-up Information    [facility].   Why:  [facility] Tuesday, August [facility], 2014 or any weekday between 8AM - 12Noon or 1-3PM for medication management.  Please be advised your first visit is on a first come first served basis and it may take a while for you to be se   Contact information:   [address] [place], [place]   [id number]  [phone]      Safety Planning and Suicide Prevention discussed: Patient no longer endorsing SI/HI or other thoughts of self harm.   Have you used any form of tobacco in the last 30 days? (Cigarettes, Smokeless Tobacco, Cigars, and/or Pipes): Yes  Has patient been referred to the Quitline?: Patient declined referral to Quitline.   Wynn BankerHodnett, Alencia Gordon Hairston 08/11/2014, 10:22 AM

## 2014-08-11 NOTE — Clinical Social Work Note (Signed)
CSW [facility] Technical sales [facility]fficer Job [facility] at [facility].  Officer Job to have some pick patient up around 13:00 for transport home.
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
## 2014-08-11 NOTE — Progress Notes (Signed)
D/C instructions/meds/follow-up appointments reviewed, pt verbalized understanding, pt's belongings returned to pt, samples given, denies SI/HI/AVH 
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
## 2014-08-11 NOTE — [facility] Group Notes (Signed)
Adult Psychoeducational Group Note  Date:  08/11/2014 Time:  0900am  Group Topic/Focus:  Goals Group:   The focus of this group is to help patients establish daily goals to achieve during treatment and discuss how the patient can incorporate goal setting into their daily lives to aide in recovery. Orientation:   The focus of this group is to educate the patient on the purpose and policies of crisis stabilization and provide a format to answer questions about their admission.  The group details unit policies and expectations of patients while admitted.  Participation Level:  Active  Participation Quality:  Attentive, Intrusive and Sharing  Affect:  Appropriate  Cognitive:  Alert and Appropriate  Insight: Appropriate and Good  Engagement in Group:  Engaged and Off Topic  Modes of Intervention:  Discussion, Education, Limit-setting, Orientation and Support  Ronald Williams, Ronald Williams 08/11/2014, 10:03 AM

## 2015-01-12 ENCOUNTER — Encounter: Payer: Self-pay | Admitting: Family Medicine

## 2015-01-12 ENCOUNTER — Other Ambulatory Visit: Payer: Self-pay

## 2015-01-12 ENCOUNTER — Ambulatory Visit (INDEPENDENT_AMBULATORY_CARE_PROVIDER_SITE_OTHER): Payer: No Typology Code available for payment source | Admitting: Family Medicine

## 2015-01-12 VITALS — BP 141/78 | HR 68 | Temp 97.4°F | Resp 14 | Ht 72.0 in | Wt 138.0 lb

## 2015-01-12 DIAGNOSIS — Z0000 Encounter for general adult medical examination without abnormal findings: Secondary | ICD-10-CM

## 2015-01-12 DIAGNOSIS — Z7189 Other specified counseling: Secondary | ICD-10-CM

## 2015-01-12 DIAGNOSIS — Z7689 Persons encountering health services in other specified circumstances: Secondary | ICD-10-CM

## 2015-01-12 DIAGNOSIS — M79641 Pain in right hand: Secondary | ICD-10-CM

## 2015-01-12 DIAGNOSIS — M545 Low back pain: Secondary | ICD-10-CM | POA: Diagnosis not present

## 2015-01-12 DIAGNOSIS — M792 Neuralgia and neuritis, unspecified: Secondary | ICD-10-CM

## 2015-01-12 LAB — CBC WITH DIFFERENTIAL/PLATELET
Basophils Absolute: 0.1 K/uL (ref 0.0–0.1)
Basophils Relative: 1 % (ref 0–1)
Eosinophils Absolute: 0.1 K/uL (ref 0.0–0.7)
Eosinophils Relative: 1 % (ref 0–5)
HCT: 43.8 % (ref 39.0–52.0)
Hemoglobin: 15 g/dL (ref 13.0–17.0)
Lymphocytes Relative: 20 % (ref 12–46)
Lymphs Abs: 1.6 K/uL (ref 0.7–4.0)
MCH: 31.4 pg (ref 26.0–34.0)
MCHC: 34.2 g/dL (ref 30.0–36.0)
MCV: 91.6 fL (ref 78.0–100.0)
MPV: 10.4 fL (ref 8.6–12.4)
Monocytes Absolute: 0.5 K/uL (ref 0.1–1.0)
Monocytes Relative: 7 % (ref 3–12)
Neutro Abs: 5.5 K/uL (ref 1.7–7.7)
Neutrophils Relative %: 71 % (ref 43–77)
Platelets: 269 K/uL (ref 150–400)
RBC: 4.78 MIL/uL (ref 4.22–5.81)
RDW: 13.4 % (ref 11.5–15.5)
WBC: 7.8 K/uL (ref 4.0–10.5)

## 2015-01-12 LAB — COMPLETE METABOLIC PANEL WITHOUT GFR
ALT: 11 U/L (ref 9–46)
AST: 10 U/L (ref 10–40)
Albumin: 4.5 g/dL (ref 3.6–5.1)
Alkaline Phosphatase: 47 U/L (ref 40–115)
BUN: 18 mg/dL (ref 7–25)
CO2: 28 mmol/L (ref 20–31)
Calcium: 9.8 mg/dL (ref 8.6–10.3)
Chloride: 103 mmol/L (ref 98–110)
Creat: 1.02 mg/dL (ref 0.60–1.35)
GFR, Est African American: >89 mL/min
GFR, Est Non African American: 87 mL/min
Glucose, Bld: 93 mg/dL (ref 65–99)
Potassium: 5.1 mmol/L (ref 3.5–5.3)
Sodium: 139 mmol/L (ref 135–146)
Total Bilirubin: 1 mg/dL (ref 0.2–1.2)
Total Protein: 7.3 g/dL (ref 6.1–8.1)

## 2015-01-12 LAB — LIPID PANEL
Cholesterol: 164 mg/dL (ref 125–200)
HDL: 54 mg/dL
LDL Cholesterol: 100 mg/dL
Total CHOL/HDL Ratio: 3 ratio
Triglycerides: 49 mg/dL
VLDL: 10 mg/dL

## 2015-01-12 MED ORDER — NAPROXEN 500 MG PO TABS: 500.0000 mg | ORAL_TABLET | Freq: Two times a day (BID) | ORAL | Status: DC

## 2015-01-12 MED ORDER — GABAPENTIN 400 MG PO CAPS: 400.0000 mg | ORAL_CAPSULE | Freq: Three times a day (TID) | ORAL | Status: AC

## 2015-01-12 MED ORDER — NAPROXEN 500 MG PO TABS: 500.0000 mg | ORAL_TABLET | Freq: Two times a day (BID) | ORAL | Status: AC

## 2015-01-12 MED ORDER — FLUTICASONE PROPIONATE 50 MCG/ACT NA SUSP: 2.0000 | Freq: Every day | NASAL | Status: DC | PRN

## 2015-01-12 MED ORDER — FLUTICASONE PROPIONATE 50 MCG/ACT NA SUSP: 2.0000 | Freq: Every day | NASAL | Status: AC | PRN

## 2015-01-12 MED ORDER — GABAPENTIN 400 MG PO CAPS: 400.0000 mg | ORAL_CAPSULE | Freq: Three times a day (TID) | ORAL | Status: DC

## 2015-01-12 NOTE — Patient Instructions (Signed)
You may pick up your medications as [facility] at [address] [place], [place] Would recommend you apply for orange card. Ask about when you pick up your medications I also recommend you follow-up with mental health. I am attaching a handout on healthy life style. Please read and attempt to followHealth Maintenance, Male A healthy lifestyle and preventative care can promote health and wellness.  Maintain regular health, dental, and eye exams.  Eat a healthy diet. Foods like vegetables, fruits, whole grains, low-fat dairy products, and lean protein foods contain the nutrients you need and are low in calories. Decrease your intake of foods high in solid fats, added sugars, and salt. Get information about a proper diet from your health care provider, if necessary.  Regular physical exercise is one of the most important things you can do for your health. Most adults should get at least 150 minutes of moderate-intensity exercise (any activity that increases your heart rate and causes you to sweat) each week. In addition, most adults need muscle-strengthening exercises on 2 or more days a week.   Maintain a healthy weight. The body mass index (BMI) is a screening tool to identify possible weight problems. It provides an estimate of body fat based on height and weight. Your health care provider can find your BMI and can help you achieve or maintain a healthy weight. For males 20 years and older:  A BMI below 18.5 is considered underweight.  A BMI of 18.5 to 24.9 is normal.  A BMI of 25 to 29.9 is considered overweight.  A BMI of 30 and above is considered obese.  Maintain normal blood lipids and cholesterol by exercising and minimizing your intake of saturated fat. Eat a balanced diet with plenty of fruits and vegetables. Blood tests for lipids and cholesterol should begin at age 12 and be repeated every 5 years. If your lipid or cholesterol levels are high, you are over  age 34, or you are at high risk for heart disease, you may need your cholesterol levels checked more frequently.Ongoing high lipid and cholesterol levels should be treated with medicines if diet and exercise are not working.  If you smoke, find out from your health care provider how to quit. If you do not use tobacco, do not start.  Lung cancer screening is recommended for adults aged 55-80 years who are at high risk for developing lung cancer because of a history of smoking. A yearly low-dose CT scan of the lungs is recommended for people who have at least a 30-pack-year history of smoking and are current smokers or have quit within the past 15 years. A pack year of smoking is smoking an average of 1 pack of cigarettes a day for 1 year (for example, a 30-pack-year history of smoking could mean smoking 1 pack a day for 30 years or 2 packs a day for 15 years). Yearly screening should continue until the smoker has stopped smoking for at least 15 years. Yearly screening should be stopped for people who develop a health problem that would prevent them from having lung cancer treatment.  If you choose to drink alcohol, do not have more than 2 drinks per day. One drink is considered to be 12 oz (360 mL) of beer, 5 oz (150 mL) of wine, or 1.5 oz (45 mL) of liquor.  Avoid the use of street drugs. Do not share needles with anyone. Ask for help if you need support or instructions about stopping the  use of drugs.  High blood pressure causes heart disease and increases the risk of stroke. High blood pressure is more likely to develop in:  People who have blood pressure in the end of the normal range (100-139/85-89 mm Hg).  People who are overweight or obese.  People who are African American.  If you are 4518-48 years of age, have your blood pressure checked every 3-5 years. If you are 48 years of age or older, have your blood pressure checked every year. You should have your blood pressure measured twice--once  when you are at a hospital or clinic, and once when you are not at a hospital or clinic. Record the average of the two measurements. To check your blood pressure when you are not at a hospital or clinic, you can use:  An automated blood pressure machine at a pharmacy.  A home blood pressure monitor.  If you are 4145-48 years old, ask your health care provider if you should take aspirin to prevent heart disease.  Diabetes screening involves taking a blood sample to check your fasting blood sugar level. This should be done once every 3 years after age 48 if you are at a normal weight and without risk factors for diabetes. Testing should be considered at a younger age or be carried out more frequently if you are overweight and have at least 1 risk factor for diabetes.  Colorectal cancer can be detected and often prevented. Most routine colorectal cancer screening begins at the age of 48 and continues through age 48. However, your health care provider may recommend screening at an earlier age if you have risk factors for colon cancer. On a yearly basis, your health care provider may provide home test kits to check for hidden blood in the stool. A small camera at the end of a tube may be used to directly examine the colon (sigmoidoscopy or colonoscopy) to detect the earliest forms of colorectal cancer. Talk to your health care provider about this at age 48 when routine screening begins. A direct exam of the colon should be repeated every 5-10 years through age 48, unless early forms of precancerous polyps or small growths are found.  People who are at an increased risk for hepatitis B should be screened for this virus. You are considered at high risk for hepatitis B if:  You were born in a country where hepatitis B occurs often. Talk with your health care provider about which countries are considered high risk.  Your parents were born in a high-risk country and you have not received a shot to protect  against hepatitis B (hepatitis B vaccine).  You have HIV or AIDS.  You use needles to inject street drugs.  You live with, or have sex with, someone who has hepatitis B.  You are a man who has sex with other men (MSM).  You get hemodialysis treatment.  You take certain medicines for conditions like cancer, organ transplantation, and autoimmune conditions.  Hepatitis C blood testing is recommended for all people born from [id number] through 1965 and any individual with known risk factors for hepatitis C.  Healthy men should no longer receive prostate-specific antigen (PSA) blood tests as part of routine cancer screening. Talk to your health care provider about prostate cancer screening.  Testicular cancer screening is not recommended for adolescents or adult males who have no symptoms. Screening includes self-exam, a health care provider exam, and other screening tests. Consult with your health care provider about any symptoms  you have or any concerns you have about testicular cancer.  Practice safe sex. Use condoms and avoid high-risk sexual practices to reduce the spread of sexually transmitted infections (STIs).  You should be screened for STIs, including gonorrhea and chlamydia if:  You are sexually active and are younger than 24 years.  You are older than 24 years, and your health care provider tells you that you are at risk for this type of infection.  Your sexual activity has changed since you were last screened, and you are at an increased risk for chlamydia or gonorrhea. Ask your health care provider if you are at risk.  If you are at risk of being infected with HIV, it is recommended that you take a prescription medicine daily to prevent HIV infection. This is called pre-exposure prophylaxis (PrEP). You are considered at risk if:  You are a man who has sex with other men (MSM).  You are a heterosexual man who is sexually active with multiple partners.  You take drugs by  injection.  You are sexually active with a partner who has HIV.  Talk with your health care provider about whether you are at high risk of being infected with HIV. If you choose to begin PrEP, you should first be tested for HIV. You should then be tested every 3 months for as long as you are taking PrEP.  Use sunscreen. Apply sunscreen liberally and repeatedly throughout the day. You should seek shade when your shadow is shorter than you. Protect yourself by wearing long sleeves, pants, a wide-brimmed hat, and sunglasses year round whenever you are outdoors.  Tell your health care provider of new moles or changes in moles, especially if there is a change in shape or color. Also, tell your health care provider if a mole is larger than the size of a pencil eraser.  A one-time screening for abdominal aortic aneurysm (AAA) and surgical repair of large AAAs by ultrasound is recommended for men aged 29-75 years who are current or former smokers.  Stay current with your vaccines (immunizations).   This information is not intended to replace advice given to you by your health care provider. Make sure you discuss any questions you have with your health care provider.   Document Released: 07/27/2007 Document Revised: 02/18/2014 Document Reviewed: 06/25/2010 Elsevier Interactive Patient Education [employer].

## 2015-01-12 NOTE — Telephone Encounter (Signed)
Refills sent into correct pharmacy. Thanks!
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
## 2015-02-02 ENCOUNTER — Ambulatory Visit: Payer: Self-pay

## 2015-04-13 ENCOUNTER — Ambulatory Visit: Payer: Self-pay | Admitting: Family Medicine
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
## 2017-06-27 ENCOUNTER — Emergency Department (HOSPITAL_BASED_OUTPATIENT_CLINIC_OR_DEPARTMENT_OTHER)
Admission: EM | Admit: 2017-06-27 | Discharge: 2017-06-27 | Disposition: A | Discharge status: Home / Self Care | Payer: No Typology Code available for payment source | Attending: Emergency Medicine | Admitting: Emergency Medicine

## 2017-06-27 ENCOUNTER — Encounter (HOSPITAL_BASED_OUTPATIENT_CLINIC_OR_DEPARTMENT_OTHER): Payer: Self-pay | Admitting: Emergency Medicine

## 2017-06-27 DIAGNOSIS — Y939 Activity, unspecified: Secondary | ICD-10-CM | POA: Insufficient documentation

## 2017-06-27 DIAGNOSIS — S71111A Laceration without foreign body, right thigh, initial encounter: Secondary | ICD-10-CM | POA: Insufficient documentation

## 2017-06-27 DIAGNOSIS — Z79899 Other long term (current) drug therapy: Secondary | ICD-10-CM | POA: Insufficient documentation

## 2017-06-27 DIAGNOSIS — S81811A Laceration without foreign body, right lower leg, initial encounter: Secondary | ICD-10-CM

## 2017-06-27 DIAGNOSIS — Z23 Encounter for immunization: Secondary | ICD-10-CM | POA: Insufficient documentation

## 2017-06-27 DIAGNOSIS — Y999 Unspecified external cause status: Secondary | ICD-10-CM | POA: Insufficient documentation

## 2017-06-27 DIAGNOSIS — F17200 Nicotine dependence, unspecified, uncomplicated: Secondary | ICD-10-CM | POA: Insufficient documentation

## 2017-06-27 DIAGNOSIS — W312XXA Contact with powered woodworking and forming machines, initial encounter: Secondary | ICD-10-CM | POA: Insufficient documentation

## 2017-06-27 DIAGNOSIS — Y929 Unspecified place or not applicable: Secondary | ICD-10-CM | POA: Insufficient documentation

## 2017-06-27 MED ORDER — CEPHALEXIN 500 MG PO CAPS: 500.0000 mg | ORAL_CAPSULE | Freq: Four times a day (QID) | ORAL | 0 refills | Status: AC

## 2017-06-27 MED ORDER — TETANUS-DIPHTH-ACELL PERTUSSIS 5-2.5-18.5 LF-MCG/0.5 IM SUSP
0.5000 mL | Freq: Once | INTRAMUSCULAR | Status: AC
  Administered 2017-06-27 (×2): 0.5 mL via INTRAMUSCULAR
  Filled 2017-06-27: qty 0.5

## 2017-06-27 MED ORDER — CEPHALEXIN 250 MG PO CAPS
500.0000 mg | ORAL_CAPSULE | Freq: Once | ORAL | Status: AC
  Administered 2017-06-27 (×2): 500 mg via ORAL
  Filled 2017-06-27: qty 2

## 2017-06-27 NOTE — ED Notes (Signed)
Pt had a superficial laceration to right upper thigh. Patient was persistent about staff not suturing wound. He did finally agree to let staff clean and dress wound
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
## 2017-06-27 NOTE — ED Notes (Signed)
Pt understood dc material and follow up precautions. NAD noted. Script given at [facility].

## 2017-06-27 NOTE — ED Provider Notes (Signed)
[facility] EMERGENCY DEPARTMENT Provider Note   CSN: [id number] Arrival date & time: 06/27/17  4098     History   Chief Complaint Chief Complaint  Patient presents with  . Laceration    HPI Ronald Williams is a 52 y.o. male.  The history is provided by the patient.  Laceration   The incident occurred 6 to 12 hours ago. Pain location: right medial thigh. The laceration is 1 cm in size. Injury mechanism: saw. The pain is at a severity of 2/10. The pain is mild. The pain has been constant since onset. He reports no foreign bodies present. His tetanus status is unknown.  Is refusing suturing.  Originally refused to even have me look at the injury.  No bleeding.  Denies that it was self inflicted.  No SI or HI  No past medical history on file.  Patient Active Problem List   Diagnosis Date Noted  . Wrist pain, right 08/08/2014  . Other back pain   . Mood disorder (HCC) 08/05/2014  . Behavioral disorder   . Major depressive disorder, single episode, severe without psychotic features [facility])     Past Surgical History:  Procedure Laterality Date  . BACK SURGERY    . CARPAL TUNNEL RELEASE          Home Medications    Prior to Admission medications   Medication Sig Start Date End Date Taking? Authorizing Provider  cephALEXin (KEFLEX) 500 MG capsule Take 1 capsule (500 mg total) by mouth 4 (four) times daily. 06/27/17   Belvia Gotschall, MD  divalproex (DEPAKOTE ER) 500 MG 24 hr tablet Take 1 tablet (500 mg total) by mouth 2 (two) times daily. Patient not taking: Reported on 01/12/2015 08/11/14   Rankin, Shuvon B, NP  fluticasone (FLONASE) 50 MCG/ACT nasal spray Place 2 sprays into both nostrils daily as needed for allergies or rhinitis. 01/12/15   [name], NP  gabapentin (NEURONTIN) 400 MG capsule Take 1 capsule (400 mg total) by mouth 3 (three) times daily. 01/12/15   [name], NP  hydrOXYzine (ATARAX/VISTARIL) 25 MG tablet Take 1 tablet (25 mg total)  by mouth 3 (three) times daily as needed for anxiety (agitation). Patient not taking: Reported on 01/12/2015 08/11/14   Rankin, Shuvon B, NP  naproxen (NAPROSYN) 500 MG tablet Take 1 tablet (500 mg total) by mouth 2 (two) times daily with a meal. 01/12/15   [name], NP  pantoprazole (PROTONIX) 40 MG tablet Take 1 tablet (40 mg total) by mouth daily. Patient not taking: Reported on 01/12/2015 08/11/14   Rankin, Shuvon B, NP  traZODone (DESYREL) 50 MG tablet Take 1 tablet (50 mg total) by mouth at bedtime as needed for sleep (May repeat X1). Patient not taking: Reported on 01/12/2015 08/11/14   Rankin, Denice Bors B, NP    Family History No family history on file.  Social History Social History   Tobacco Use  . Smoking status: Current Every Day Smoker  . Smokeless tobacco: Never Used  Substance Use Topics  . Alcohol use: Yes    Alcohol/week: 0.0 oz    Comment: social drinker  . Drug use: No     Allergies   Codeine   Review of Systems Review of Systems  Skin: Positive for wound.  All other systems reviewed and are negative.    Physical Exam Updated Vital Signs BP (!) 151/86   Pulse 67   Temp 97.6 F (36.4 C)   Resp 18  Ht 6' (1.829 m)   Wt 65.8 kg (145 lb)   SpO2 100%   BMI 19.67 kg/m   Physical Exam  Constitutional: He is oriented to person, place, and time. He appears well-developed and well-nourished. No distress.  HENT:  Head: Normocephalic and atraumatic.  Mouth/Throat: No oropharyngeal exudate.  Eyes: Pupils are equal, round, and reactive to light. Conjunctivae are normal.  Neck: Normal range of motion. Neck supple.  Cardiovascular: Normal rate, regular rhythm, normal heart sounds and intact distal pulses.  Pulmonary/Chest: Effort normal and breath sounds normal. No stridor. He has no wheezes. He has no rales.  Abdominal: Soft. Bowel sounds are normal. He exhibits no mass. There is no tenderness. There is no rebound and no guarding.  Musculoskeletal:  Normal range of motion.  Neurological: He is alert and oriented to person, place, and time.  Skin: Skin is warm and dry. Capillary refill takes less than 2 seconds.        ED Treatments / Results  Labs (all labs ordered are listed, but only abnormal results are displayed) Labs Reviewed - No data to display  EKG None  Radiology No results found.  Procedures Procedures (including critical care time)  Medications Ordered in ED Medications  Tdap (BOOSTRIX) injection 0.5 mL (0.5 mLs Intramuscular Given 06/27/17 0549)  cephALEXin (KEFLEX) capsule 500 mg (500 mg Oral Given 06/27/17 0549)       Final Clinical Impressions(s) / ED Diagnoses   Final diagnoses:  Laceration of right lower extremity, initial encounter   Wound care provided.  He has decision making capacity to refuse care.   Return for weakness, numbness, changes in vision or speech, fevers >100.4 unrelieved by medication, shortness of breath, intractable vomiting, or diarrhea, abdominal pain, Inability to tolerate liquids or food, cough, altered mental status or any concerns. No signs of systemic illness or infection. The patient is nontoxic-appearing on exam and vital signs are within normal limits.   I have reviewed the triage vital signs and the nursing notes. Pertinent labs &imaging results that were available during my care of the patient were reviewed by me and considered in my medical decision making (see chart for details).  After history, exam, and medical workup I feel the patient has been appropriately medically screened and is safe for discharge home. Pertinent diagnoses were discussed with the patient. Patient was given return precautions. ED Discharge Orders        Ordered    cephALEXin (KEFLEX) 500 MG capsule  4 times daily     06/27/17 0550       Davionna Blacksher, MD 06/27/17 [phone]

## 2017-07-13 ENCOUNTER — Emergency Department (HOSPITAL_COMMUNITY)
Admission: EM | Admit: 2017-07-13 | Discharge: 2017-07-13 | Disposition: A | Discharge status: Home / Self Care | Payer: No Typology Code available for payment source | Attending: Emergency Medicine | Admitting: Emergency Medicine

## 2017-07-13 ENCOUNTER — Encounter (HOSPITAL_COMMUNITY): Payer: Self-pay | Admitting: Emergency Medicine

## 2017-07-13 DIAGNOSIS — F3181 Bipolar II disorder: Secondary | ICD-10-CM | POA: Insufficient documentation

## 2017-07-13 DIAGNOSIS — Z008 Encounter for other general examination: Secondary | ICD-10-CM | POA: Insufficient documentation

## 2017-07-13 DIAGNOSIS — F17200 Nicotine dependence, unspecified, uncomplicated: Secondary | ICD-10-CM | POA: Insufficient documentation

## 2017-07-13 DIAGNOSIS — F22 Delusional disorders: Secondary | ICD-10-CM | POA: Insufficient documentation

## 2017-07-13 DIAGNOSIS — L299 Pruritus, unspecified: Secondary | ICD-10-CM | POA: Insufficient documentation

## 2017-07-13 LAB — RAPID URINE DRUG SCREEN, HOSP PERFORMED
Amphetamines: NOT DETECTED
Barbiturates: NOT DETECTED
Benzodiazepines: NOT DETECTED
Cocaine: NOT DETECTED
Opiates: NOT DETECTED
Tetrahydrocannabinol: POSITIVE — AB

## 2017-07-13 LAB — COMPREHENSIVE METABOLIC PANEL WITH GFR
ALT: 80 U/L — ABNORMAL HIGH (ref 17–63)
AST: 66 U/L — ABNORMAL HIGH (ref 15–41)
Albumin: 3.7 g/dL (ref 3.5–5.0)
Alkaline Phosphatase: 39 U/L (ref 38–126)
Anion gap: 7 (ref 5–15)
BUN: 18 mg/dL (ref 6–20)
CO2: 26 mmol/L (ref 22–32)
Calcium: 8.7 mg/dL — ABNORMAL LOW (ref 8.9–10.3)
Chloride: 108 mmol/L (ref 101–111)
Creatinine, Ser: 1.17 mg/dL (ref 0.61–1.24)
GFR calc Af Amer: >60 mL/min
GFR calc non Af Amer: >60 mL/min
Glucose, Bld: 124 mg/dL — ABNORMAL HIGH (ref 65–99)
Potassium: 3.9 mmol/L (ref 3.5–5.1)
Sodium: 141 mmol/L (ref 135–145)
Total Bilirubin: 0.7 mg/dL (ref 0.3–1.2)
Total Protein: 6.1 g/dL — ABNORMAL LOW (ref 6.5–8.1)

## 2017-07-13 LAB — CBC WITH DIFFERENTIAL/PLATELET
Abs Immature Granulocytes: 0 K/uL (ref 0.0–0.1)
Basophils Absolute: 0 K/uL (ref 0.0–0.1)
Basophils Relative: 1 %
Eosinophils Absolute: 1.1 K/uL — ABNORMAL HIGH (ref 0.0–0.7)
Eosinophils Relative: 16 %
HCT: 33.5 % — ABNORMAL LOW (ref 39.0–52.0)
Hemoglobin: 11.2 g/dL — ABNORMAL LOW (ref 13.0–17.0)
Immature Granulocytes: 0 %
Lymphocytes Relative: 26 %
Lymphs Abs: 1.7 K/uL (ref 0.7–4.0)
MCH: 30.5 pg (ref 26.0–34.0)
MCHC: 33.4 g/dL (ref 30.0–36.0)
MCV: 91.3 fL (ref 78.0–100.0)
Monocytes Absolute: 0.5 K/uL (ref 0.1–1.0)
Monocytes Relative: 7 %
Neutro Abs: 3.3 K/uL (ref 1.7–7.7)
Neutrophils Relative %: 50 %
Platelets: 217 K/uL (ref 150–400)
RBC: 3.67 MIL/uL — ABNORMAL LOW (ref 4.22–5.81)
RDW: 12.6 % (ref 11.5–15.5)
WBC: 6.6 K/uL (ref 4.0–10.5)

## 2017-07-13 LAB — ACETAMINOPHEN LEVEL: Acetaminophen (Tylenol), Serum: <10 ug/mL — ABNORMAL LOW (ref 10–30)

## 2017-07-13 LAB — ETHANOL: Alcohol, Ethyl (B): <10 mg/dL

## 2017-07-13 LAB — SALICYLATE LEVEL: Salicylate Lvl: <7 mg/dL (ref 2.8–30.0)

## 2017-07-13 MED ORDER — ACETAMINOPHEN 325 MG PO TABS
650.0000 mg | ORAL_TABLET | ORAL | Status: DC | PRN
  Administered 2017-07-13 (×2): 650 mg via ORAL
  Filled 2017-07-13: qty 2

## 2017-07-13 MED ORDER — HYDROXYZINE HCL 25 MG PO TABS: 25.0000 mg | ORAL_TABLET | Freq: Four times a day (QID) | ORAL | 0 refills | Status: AC

## 2017-07-13 MED ORDER — HYDROXYZINE HCL 25 MG PO TABS
25.0000 mg | ORAL_TABLET | Freq: Once | ORAL | Status: AC
  Administered 2017-07-13 (×2): 25 mg via ORAL
  Filled 2017-07-13: qty 1
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
## 2017-07-13 NOTE — Progress Notes (Signed)
Patient was seen by me today via tele-psych and I have consulted with Dr. Lucianne MussKumar.  Patient does not meet inpatient criteria and is psychiatrically cleared.  Patient denies any SI/HI/AVH and contracts for safety.  Patient is alert and oriented x4.
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
## 2017-07-13 NOTE — ED Provider Notes (Addendum)
[facility] EMERGENCY DEPARTMENT Provider Note   CSN: [id number] Arrival date & time: 07/13/17  1329     History   Chief Complaint Chief Complaint  Patient presents with  . Pruritis    HPI Susan Arana is a 52 y.o. male past medical history of mood and behavior disorder, depression, presenting to the ED with complaint of multiple insect bites and itching for about 1 week.  He states he thinks he has bugs all over his body and believes they are tics.  He states he has been picking them off and then applying Neosporin.  Areas are located to bilateral upper and lower extremities, face, and back.  Denies any fevers or purulent drainage.  Also reports that he feels as though somebody is poisoning him.  He states he does not know who would be poisoning him nor what he is being poisoned with though states he has a "good intuition" and has a "feeling."  Denies any other symptoms.  Denies suicidal or homicidal ideation. Stays he lives alone and feels safe at home. Endorses marijuana use and ocassional alcohol use.   Patient stepmother is present, reporting she brought him to the ED for psychiatric evaluation though this reason is unknown to him.  She states he has been acting "psychotic" for the past 2 weeks.  She reports he thinks that everybody is out to get him, and thinks somebody has been poisoning him.  She states he has been telling her about these insect bites over his body, though she thinks he has not been bitten by anything.  She states he has lost a significant amount of weight in the past month and is not sure if he is sleeping at home.  Reports she was called by the sheriff today, patient reportedly walked into a church and placed a plate of rocks, a bag of trash and a bag of change on the alter as an offering to God.  She states he thinks the rocks are magical especially looks for rocks shaped as triangles.  She also reports that he thinks the trucks that passed by his  house on the highway or magnetic and causing issues with his truck.  His concern for his well-being, does not think he would harm her though is not sure if he would attempt to harm others with his thought that they are out to get him.  She states "he knows it is not right to harm other people, though I'm just not sure if he would."  She denies previous psychiatric diagnoses of schizophrenia, bipolar, or other delusional disorders.  Does report he has been diagnosed with ADHD in the past.  States he has not had any medications in some time.  The history is provided by the patient and a relative.    History reviewed. No pertinent past medical history.  Patient Active Problem List   Diagnosis Date Noted  . Wrist pain, right 08/08/2014  . Other back pain   . Mood disorder (HCC) 08/05/2014  . Behavioral disorder   . Major depressive disorder, single episode, severe without psychotic features [facility])     Past Surgical History:  Procedure Laterality Date  . BACK SURGERY    . CARPAL TUNNEL RELEASE          Home Medications    Prior to Admission medications   Medication Sig Start Date End Date Taking? Authorizing Provider  cephALEXin (KEFLEX) 500 MG capsule Take 1 capsule (500 mg total) by mouth 4 (  four) times daily. Patient not taking: Reported on 07/13/2017 06/27/17   Palumbo, April, MD  divalproex (DEPAKOTE ER) 500 MG 24 hr tablet Take 1 tablet (500 mg total) by mouth 2 (two) times daily. Patient not taking: Reported on 01/12/2015 08/11/14   Rankin, Shuvon B, NP  fluticasone (FLONASE) 50 MCG/ACT nasal spray Place 2 sprays into both nostrils daily as needed for allergies or rhinitis. Patient not taking: Reported on 07/13/2017 01/12/15   [name], NP  gabapentin (NEURONTIN) 400 MG capsule Take 1 capsule (400 mg total) by mouth 3 (three) times daily. Patient not taking: Reported on 07/13/2017 01/12/15   [name], NP  hydrOXYzine (ATARAX/VISTARIL) 25 MG tablet Take 1 tablet (25 mg  total) by mouth every 6 (six) hours. 07/13/17   Vinay Ertl, [place] N, PA-C  naproxen (NAPROSYN) 500 MG tablet Take 1 tablet (500 mg total) by mouth 2 (two) times daily with a meal. Patient not taking: Reported on 07/13/2017 01/12/15   [name], NP  pantoprazole (PROTONIX) 40 MG tablet Take 1 tablet (40 mg total) by mouth daily. Patient not taking: Reported on 01/12/2015 08/11/14   Rankin, Shuvon B, NP  traZODone (DESYREL) 50 MG tablet Take 1 tablet (50 mg total) by mouth at bedtime as needed for sleep (May repeat X1). Patient not taking: Reported on 01/12/2015 08/11/14   Rankin, Denice Bors B, NP    Family History No family history on file.  Social History Social History   Tobacco Use  . Smoking status: Current Every Day Smoker  . Smokeless tobacco: Never Used  Substance Use Topics  . Alcohol use: Yes    Alcohol/week: 0.0 oz    Comment: social drinker  . Drug use: No     Allergies   Codeine   Review of Systems Review of Systems  Skin: Positive for wound.  Psychiatric/Behavioral: Negative for dysphoric mood and suicidal ideas.  All other systems reviewed and are negative.    Physical Exam Updated Vital Signs BP 137/88   Pulse 63   Temp 97.6 F (36.4 C) (Oral)   Resp 20   Ht 6' (1.829 m)   Wt 56.7 kg (125 lb)   SpO2 100%   BMI 16.95 kg/m   Physical Exam  Constitutional: He appears well-developed and well-nourished. No distress.  HENT:  Head: Normocephalic and atraumatic.  Eyes: Pupils are equal, round, and reactive to light. Conjunctivae and EOM are normal.  Cardiovascular: Normal rate, regular rhythm and intact distal pulses.  Pulmonary/Chest: Effort normal and breath sounds normal.  Abdominal: Soft.  Neurological: He is alert.  Skin: Skin is warm.  Multiple scabbed areas of excoriation on extremities, face and back. Pt pointing out "ticks" which appear to be scabs. No purulent drainage from wounds or evidence of infection.  Psychiatric: He has a normal mood and  affect. His behavior is normal. His speech is rapid and/or pressured. Thought content is paranoid. He expresses no homicidal and no suicidal ideation. He expresses no suicidal plans and no homicidal plans.  Pt is talking upon entering room, though no one else is present. Pt is cooperative on exam, speaking quickly and appears anxious. Expressing "intuition" that he is being poisoned. Does not know what he would be poisoned with or who would be poisoning him, though states he "has a feeling."   Nursing note and vitals reviewed.    ED Treatments / Results  Labs (all labs ordered are listed, but only abnormal results are displayed) Labs Reviewed  COMPREHENSIVE  METABOLIC PANEL - Abnormal; Notable for the following components:      Result Value   Glucose, Bld 124 (*)    Calcium 8.7 (*)    Total Protein 6.1 (*)    AST 66 (*)    ALT 80 (*)    All other components within normal limits  RAPID URINE DRUG SCREEN, HOSP PERFORMED - Abnormal; Notable for the following components:   Tetrahydrocannabinol POSITIVE (*)    All other components within normal limits  CBC WITH DIFFERENTIAL/PLATELET - Abnormal; Notable for the following components:   RBC 3.67 (*)    Hemoglobin 11.2 (*)    HCT 33.5 (*)    Eosinophils Absolute 1.1 (*)    All other components within normal limits  ACETAMINOPHEN LEVEL - Abnormal; Notable for the following components:   Acetaminophen (Tylenol), Serum <10 (*)    All other components within normal limits  ETHANOL  SALICYLATE LEVEL    EKG EKG Interpretation  Date/Time:  Sunday July 13 2017 14:51:35 EDT Ventricular Rate:  65 PR Interval:    QRS Duration: 84 QT Interval:  399 QTC Calculation: 415 R Axis:   75 Text Interpretation:  Sinus rhythm Short PR interval Nonspecific T abnrm, anterolateral leads No previous tracing Confirmed by Gwyneth SproutPlunkett, Whitney ([id number]) on 07/13/2017 9:01:01 PM   Radiology No results found.  Procedures Procedures (including critical care  time)  Medications Ordered in ED Medications  acetaminophen (TYLENOL) tablet 650 mg (650 mg Oral Given 07/13/17 1831)  hydrOXYzine (ATARAX/VISTARIL) tablet 25 mg (25 mg Oral Given 07/13/17 1538)     Initial Impression / Assessment and Plan / ED Course  I have reviewed the triage vital signs and the nursing notes.  Pertinent labs & imaging results that were available during my care of the patient were reviewed by me and considered in my medical decision making (see chart for details).     Pt in the ED with his step mother with complaint of insect bites and concerns that some one is poisoning him. Pt's step mother reporting worsening paranoia and delusional thoughts x 2 weeks. On initial evaluation, pt appears anxious though is cooperative. Pt with multiple areas of excoriation on extremities, back, face.  No signs of infection, no insects visualized, patient points out areas that he believes are insects though they appear to be scabs.  Patient repeatedly denies any suicidal or homicidal ideation.  Patient discussed with his stepmother, who states symptoms have been ongoing for about 2 weeks.  Does not believe that he poses a threat to himself or others, though is concerned for his mental well-being.  Patient treated in the ED with Vistaril for scratching and anxiety.  Due to patient's paranoia and delusional thoughts, TTS consulted for evaluation. Labs reassuring, bilirubin wnl.   TTS and psychiatry evaluated patient, and recommends patient is safe for discharge.  Patient reevaluated and discussed recommendations with both the patient and his stepmother who agreed to plan for discharge [facility].  I talked privately with patient's stepmother regarding TTS recommendation, and she reports patient is calmer following the Vistaril.  She states she lives nearby and can check on him daily and is comfortable with his discharge.  States she will be sure to get [facility] to [facility] tomorrow  or Tuesday for counseling.  States she will not hesitate to return him to the ED for any further concerns.  Patient discussed with Dr. Anitra LauthPlunkett.  Discussed results, findings, treatment and follow up. Patient advised of  return precautions. Patient verbalized understanding and agreed with plan.  Final Clinical Impressions(s) / ED Diagnoses   Final diagnoses:  Pruritus  Delusion of infestation (HCC)  Paranoia [facility])    ED Discharge Orders        Ordered    hydrOXYzine (ATARAX/VISTARIL) 25 MG tablet  Every 6 hours     07/13/17 1818       Malakhai Beitler, [place] N, PA-C 07/13/17 2056    Maribell Demeo, [place] N, PA-C 07/13/17 2107    Gwyneth Sprout, MD 07/13/17 2127
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
## 2017-10-15 ENCOUNTER — Other Ambulatory Visit: Payer: Self-pay

## 2017-10-15 ENCOUNTER — Emergency Department (HOSPITAL_BASED_OUTPATIENT_CLINIC_OR_DEPARTMENT_OTHER)
Admission: EM | Admit: 2017-10-15 | Discharge: 2017-10-15 | Disposition: A | Discharge status: Home / Self Care | Payer: Self-pay | Attending: Emergency Medicine | Admitting: Emergency Medicine

## 2017-10-15 ENCOUNTER — Encounter (HOSPITAL_BASED_OUTPATIENT_CLINIC_OR_DEPARTMENT_OTHER): Payer: Self-pay | Admitting: *Deleted

## 2017-10-15 DIAGNOSIS — Y999 Unspecified external cause status: Secondary | ICD-10-CM | POA: Insufficient documentation

## 2017-10-15 DIAGNOSIS — F17210 Nicotine dependence, cigarettes, uncomplicated: Secondary | ICD-10-CM | POA: Insufficient documentation

## 2017-10-15 DIAGNOSIS — W458XXA Other foreign body or object entering through skin, initial encounter: Secondary | ICD-10-CM | POA: Insufficient documentation

## 2017-10-15 DIAGNOSIS — Y9389 Activity, other specified: Secondary | ICD-10-CM | POA: Insufficient documentation

## 2017-10-15 DIAGNOSIS — T1592XA Foreign body on external eye, part unspecified, left eye, initial encounter: Secondary | ICD-10-CM | POA: Insufficient documentation

## 2017-10-15 DIAGNOSIS — Y929 Unspecified place or not applicable: Secondary | ICD-10-CM | POA: Insufficient documentation

## 2017-10-15 MED ORDER — TETRACAINE HCL 0.5 % OP SOLN
1.0000 [drp] | Freq: Once | OPHTHALMIC | Status: AC
  Administered 2017-10-15 (×2): 1 [drp] via OPHTHALMIC

## 2017-10-15 MED ORDER — FLUORESCEIN SODIUM 1 MG OP STRP
ORAL_STRIP | OPHTHALMIC | Status: AC
  Administered 2017-10-15 (×2): 1 via OPHTHALMIC
  Filled 2017-10-15: qty 1

## 2017-10-15 MED ORDER — TETRACAINE HCL 0.5 % OP SOLN
OPHTHALMIC | Status: AC
  Filled 2017-10-15: qty 4

## 2017-10-15 MED ORDER — MOXIFLOXACIN HCL 0.5 % OP SOLN: 1.0000 [drp] | Freq: Four times a day (QID) | OPHTHALMIC | 0 refills | Status: AC

## 2017-10-15 MED ORDER — FLUORESCEIN SODIUM 1 MG OP STRP
1.0000 | ORAL_STRIP | Freq: Once | OPHTHALMIC | Status: AC
  Administered 2017-10-15: 1 via OPHTHALMIC

## 2017-10-15 MED FILL — MOXIFLOXACIN HCL 0.5 % SOLN: 0.5 | 15 days supply | Qty: 3 | Fill #0

## 2017-10-15 NOTE — ED Provider Notes (Signed)
[facility] EMERGENCY DEPARTMENT Provider Note   CSN: [id number] Arrival date & time: 10/15/17  0906     History   Chief Complaint Chief Complaint  Patient presents with  . Eye Problem    HPI Ronald Williams is a 52 y.o. male who presents for evaluation of left eye irritation that is been ongoing for the last 3 days.  Patient reports that on Monday, he was cutting a bunch of metal with a saw.  He states that at that time, he felt like a fleck of metal in his eye.  He does report that he is wearing glasses at the time.  He states that he tried to flush out the eye and states that he is continued to have redness, irritation and pain to the left eye.  He states that he is also having photophobia.  He states he does not wear glasses or contacts normally.  He states he has had some subsequent blurry vision.  He denies any trauma.   The history is provided by the patient.    History reviewed. No pertinent past medical history.  Patient Active Problem List   Diagnosis Date Noted  . Wrist pain, right 08/08/2014  . Other back pain   . Mood disorder (HCC) 08/05/2014  . Behavioral disorder   . Major depressive disorder, single episode, severe without psychotic features [facility])     Past Surgical History:  Procedure Laterality Date  . BACK SURGERY    . CARPAL TUNNEL RELEASE          Home Medications    Prior to Admission medications   Medication Sig Start Date End Date Taking? Authorizing Provider  cephALEXin (KEFLEX) 500 MG capsule Take 1 capsule (500 mg total) by mouth 4 (four) times daily. Patient not taking: Reported on 07/13/2017 06/27/17   Palumbo, April, MD  divalproex (DEPAKOTE ER) 500 MG 24 hr tablet Take 1 tablet (500 mg total) by mouth 2 (two) times daily. Patient not taking: Reported on 01/12/2015 08/11/14   Rankin, Shuvon B, NP  fluticasone (FLONASE) 50 MCG/ACT nasal spray Place 2 sprays into both nostrils daily as needed for allergies or rhinitis. Patient not  taking: Reported on 07/13/2017 01/12/15   [name], NP  gabapentin (NEURONTIN) 400 MG capsule Take 1 capsule (400 mg total) by mouth 3 (three) times daily. Patient not taking: Reported on 07/13/2017 01/12/15   [name], NP  hydrOXYzine (ATARAX/VISTARIL) 25 MG tablet Take 1 tablet (25 mg total) by mouth every 6 (six) hours. 07/13/17   Robinson, [place] N, PA-C  moxifloxacin (VIGAMOX) 0.5 % ophthalmic solution Place 1 drop into the left eye 4 (four) times daily. 10/15/17   Maxwell Caul, PA-C  naproxen (NAPROSYN) 500 MG tablet Take 1 tablet (500 mg total) by mouth 2 (two) times daily with a meal. Patient not taking: Reported on 07/13/2017 01/12/15   [name], NP  pantoprazole (PROTONIX) 40 MG tablet Take 1 tablet (40 mg total) by mouth daily. Patient not taking: Reported on 01/12/2015 08/11/14   Rankin, Shuvon B, NP  traZODone (DESYREL) 50 MG tablet Take 1 tablet (50 mg total) by mouth at bedtime as needed for sleep (May repeat X1). Patient not taking: Reported on 01/12/2015 08/11/14   Rankin, Denice Bors B, NP    Family History History reviewed. No pertinent family history.  Social History Social History   Tobacco Use  . Smoking status: Current Every Day Smoker  . Smokeless tobacco: Never Used  Substance  Use Topics  . Alcohol use: Yes    Alcohol/week: 0.0 standard drinks    Comment: social drinker  . Drug use: No     Allergies   Codeine   Review of Systems Review of Systems  Eyes: Positive for photophobia, pain, redness and visual disturbance.  All other systems reviewed and are negative.    Physical Exam Updated Vital Signs BP (!) 148/82 (BP Location: Right Arm)   Pulse (!) 53   Temp 98.2 F (36.8 C) (Oral)   Resp 18   SpO2 100%   Physical Exam  Constitutional: He appears well-developed and well-nourished.  HENT:  Head: Normocephalic and atraumatic.  Eyes: Pupils are equal, round, and reactive to light. EOM are normal. Right eye exhibits no discharge.  Left eye exhibits no discharge. Left conjunctiva is injected. No scleral icterus.  Slit lamp exam:      The left eye shows foreign body.  Small metallic foreign body noted to the approximately 2 o'clock region with a rust ring around the area. Consensual pain noted. EOMS intact.   Pulmonary/Chest: Effort normal.  Neurological: He is alert.  Skin: Skin is warm and dry.  Psychiatric: He has a normal mood and affect. His speech is normal and behavior is normal.  Nursing note and vitals reviewed.    ED Treatments / Results  Labs (all labs ordered are listed, but only abnormal results are displayed) Labs Reviewed - No data to display  EKG None  Radiology No results found.  Procedures Procedures (including critical care time)  Medications Ordered in ED Medications  tetracaine (PONTOCAINE) 0.5 % ophthalmic solution 1 drop (1 drop Both Eyes Given 10/15/17 1009)  fluorescein ophthalmic strip 1 strip (1 strip Both Eyes Given 10/15/17 1010)     Initial Impression / Assessment and Plan / ED Course  I have reviewed the triage vital signs and the nursing notes.  Pertinent labs & imaging results that were available during my care of the patient were reviewed by me and considered in my medical decision making (see chart for details).     52 y.o. M who presents for evaluation of left eye pain, redness, irritation, blurry vision, photophobia for the last 4 days after getting a piece of metal in. He does not wear glasses or contacts. Patient is afebrile, non-toxic appearing, sitting comfortably on examination table. Vital signs reviewed and stable. On exam, he has a foreign body noted at the approximate 2 o'clock region with a surrounding rust ring. EOMs intact. Will plan to evaluate with slit lamp and IOPs.   Slit lamp evaluation showed foreign body with surrounding rust ring. Attempted removal with a sterile Q tip but was unsuccessful. Will attempt to irrigate the eye.      Visual  Acuity  Right Eye Distance: 20/20 Left Eye Distance: 20/30 Bilateral Distance: 20/20   Intraocular pressure as documented below:  Left IOP: 10, 12 Right IOP: 15, 16    Patient's eye irrigated with approximately 1L of fluid. On re-evaluation, he still has foreign body that I am unable to remove with a sterile q-tip. Plan to consult optho.   Discussed patient with Dr. Sherrine Maples (Optho). Recommends that patient come to his office for further evaluation and foreign body removal. He would like patient to be started on either polytrim or moxifloxacin eye drops.   Updated patient on plan. Instructed him to go to Dr. Gordy Councilman office at 2pm. He is agreeable. Patient had ample opportunity for questions and discussion. All patient's  questions were answered with full understanding. Strict return precautions discussed. Patient expresses understanding and agreement to plan.   Final Clinical Impressions(s) / ED Diagnoses   Final diagnoses:  Foreign body of left eye, initial encounter    ED Discharge Orders         Ordered    moxifloxacin (VIGAMOX) 0.5 % ophthalmic solution  4 times daily     10/15/17 1124           Rosana Hoes 10/15/17 1129    Little, Ambrose Finland, MD 10/17/17 [phone]

## 2017-10-15 NOTE — ED Triage Notes (Signed)
Pt reports he was cutting tin on Monday and a piece got into his left eye, has tried flushing and using eye drops, cont with redness and pain. Pt states "It feels like there is something still in there."

## 2017-10-15 NOTE — Discharge Instructions (Signed)
Use antibiotic eyedrops as directed.  Go directly to Dr. Gordy Councilman office at 2 PM for an appointment.  His office is expecting you.
